# Patient Record
Sex: Male | Born: 1950 | Race: Black or African American | Hispanic: No | Marital: Married | Smoking: Former smoker
Health system: Southern US, Community
[De-identification: ages and names within clinical notes are randomized; demographics above are authoritative.]

## PROBLEM LIST (undated history)

## (undated) DIAGNOSIS — E119 Type 2 diabetes mellitus without complications: Secondary | ICD-10-CM

## (undated) DIAGNOSIS — E7800 Pure hypercholesterolemia, unspecified: Secondary | ICD-10-CM

## (undated) DIAGNOSIS — I219 Acute myocardial infarction, unspecified: Secondary | ICD-10-CM

## (undated) DIAGNOSIS — I10 Essential (primary) hypertension: Secondary | ICD-10-CM

## (undated) HISTORY — PX: CATARACT EXTRACTION: SUR2

## (undated) HISTORY — PX: CORONARY ANGIOPLASTY WITH STENT PLACEMENT: SHX49

## (undated) HISTORY — PX: HERNIA REPAIR: SHX51

---

## 2003-02-05 ENCOUNTER — Encounter: Payer: Self-pay | Admitting: Otolaryngology

## 2003-02-05 ENCOUNTER — Encounter: Admission: RE | Admit: 2003-02-05 | Discharge: 2003-02-05 | Payer: Self-pay | Admitting: Otolaryngology

## 2003-03-10 ENCOUNTER — Emergency Department (HOSPITAL_COMMUNITY): Admission: EM | Admit: 2003-03-10 | Discharge: 2003-03-11 | Payer: Self-pay | Admitting: Emergency Medicine

## 2003-03-16 ENCOUNTER — Encounter: Admission: RE | Admit: 2003-03-16 | Discharge: 2003-06-14 | Payer: Self-pay | Admitting: Family Medicine

## 2003-06-15 ENCOUNTER — Encounter: Payer: Self-pay | Admitting: General Practice

## 2003-06-15 ENCOUNTER — Encounter: Admission: RE | Admit: 2003-06-15 | Discharge: 2003-06-15 | Payer: Self-pay | Admitting: General Practice

## 2005-02-12 ENCOUNTER — Ambulatory Visit (HOSPITAL_COMMUNITY): Admission: RE | Admit: 2005-02-12 | Discharge: 2005-02-12 | Payer: Self-pay | Admitting: Otolaryngology

## 2016-12-04 ENCOUNTER — Emergency Department (HOSPITAL_BASED_OUTPATIENT_CLINIC_OR_DEPARTMENT_OTHER): Payer: Managed Care, Other (non HMO)

## 2016-12-04 ENCOUNTER — Emergency Department (HOSPITAL_BASED_OUTPATIENT_CLINIC_OR_DEPARTMENT_OTHER)
Admission: EM | Admit: 2016-12-04 | Discharge: 2016-12-05 | Disposition: A | Discharge status: Home / Self Care | Payer: Managed Care, Other (non HMO) | Attending: Emergency Medicine | Admitting: Emergency Medicine

## 2016-12-04 ENCOUNTER — Encounter (HOSPITAL_BASED_OUTPATIENT_CLINIC_OR_DEPARTMENT_OTHER): Payer: Self-pay

## 2016-12-04 DIAGNOSIS — I252 Old myocardial infarction: Secondary | ICD-10-CM | POA: Diagnosis not present

## 2016-12-04 DIAGNOSIS — Z7984 Long term (current) use of oral hypoglycemic drugs: Secondary | ICD-10-CM | POA: Diagnosis not present

## 2016-12-04 DIAGNOSIS — E119 Type 2 diabetes mellitus without complications: Secondary | ICD-10-CM | POA: Insufficient documentation

## 2016-12-04 DIAGNOSIS — Z79899 Other long term (current) drug therapy: Secondary | ICD-10-CM | POA: Insufficient documentation

## 2016-12-04 DIAGNOSIS — H9319 Tinnitus, unspecified ear: Secondary | ICD-10-CM | POA: Insufficient documentation

## 2016-12-04 DIAGNOSIS — Z87891 Personal history of nicotine dependence: Secondary | ICD-10-CM | POA: Insufficient documentation

## 2016-12-04 DIAGNOSIS — Z5181 Encounter for therapeutic drug level monitoring: Secondary | ICD-10-CM | POA: Diagnosis not present

## 2016-12-04 DIAGNOSIS — R42 Dizziness and giddiness: Secondary | ICD-10-CM | POA: Diagnosis present

## 2016-12-04 DIAGNOSIS — I10 Essential (primary) hypertension: Secondary | ICD-10-CM | POA: Insufficient documentation

## 2016-12-04 DIAGNOSIS — Z7982 Long term (current) use of aspirin: Secondary | ICD-10-CM | POA: Insufficient documentation

## 2016-12-04 HISTORY — DX: Type 2 diabetes mellitus without complications: E11.9

## 2016-12-04 HISTORY — DX: Essential (primary) hypertension: I10

## 2016-12-04 HISTORY — DX: Acute myocardial infarction, unspecified: I21.9

## 2016-12-04 HISTORY — DX: Pure hypercholesterolemia, unspecified: E78.00

## 2016-12-04 LAB — CBG MONITORING, ED: GLUCOSE-CAPILLARY: 156 mg/dL — AB (ref 65–99)

## 2016-12-04 NOTE — ED Triage Notes (Signed)
Per grandson-pt became dizzy, HA, fell x 2 started approx 8pm-denies HA at this time-steady gait

## 2016-12-04 NOTE — ED Provider Notes (Signed)
[facility] Note   CSN: [id number] Arrival date & time: 12/04/16  2240  By signing my name below, I, Rosario Adie, attest that this documentation has been prepared under the direction and in the presence of Zadie Rhine, MD. Electronically Signed: Rosario Adie, ED Scribe. 12/04/16. 11:15 PM.  History   Chief Complaint Chief Complaint  Patient presents with  . Dizziness   The history is provided by the patient and a relative. No language interpreter was used.  Dizziness  Quality:  Room spinning Severity:  Moderate Onset quality:  Sudden Progression:  Resolved Chronicity:  New Context: not with loss of consciousness   Ineffective treatments:  None tried Associated symptoms: tinnitus   Associated symptoms: no chest pain, no headaches and no weakness     HPI Comments: Jerry Porter is a 65 y.o. male with a PMHx of DM, HLD, HTN, and prior MI, who presents to the Emergency Department complaining of sudden onset, resolved episode of room-spinning dizziness beginning approximately 4 hours ago at 7:00PM. 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 He states that his sense of balance was secondarily affected by his dizziness, causing him to fall twice onto the ground during onset. He denies LOC, head injury, or injury otherwise during this. Pt additionally states his hearing in his left became altered (?tinnitus) during the onset of his symptoms. Prior to the onset of his dizziness today he states that he was feeling at his baseline and asymptomatic. Per family, he has otherwise been acting at his baseline since this issue resolved. He denies headache, visual disturbance, chest pain, abdominal pain, focal numbness/weakness, or any other associated symptoms.   Past Medical History:  Diagnosis Date  . Diabetes mellitus without complication (HCC)   . High cholesterol   . Hypertension   .  Myocardial infarct    There are no active problems to display for this patient.  Past Surgical History:  Procedure Laterality Date  . CATARACT EXTRACTION    . CORONARY ANGIOPLASTY WITH STENT PLACEMENT    . HERNIA REPAIR      Home Medications    Prior to Admission medications   Medication Sig Start Date End Date Taking? Authorizing Provider  amLODipine (NORVASC) 10 MG tablet Take 10 mg by mouth daily.   Yes Historical Provider, MD  aspirin 325 MG tablet Take 325 mg by mouth daily.   Yes Historical Provider, MD  atorvastatin (LIPITOR) 80 MG tablet Take 80 mg by mouth daily.   Yes Historical Provider, MD  glipiZIDE (GLUCOTROL) 5 MG tablet Take by mouth daily before breakfast.   Yes Historical Provider, MD  losartan-hydrochlorothiazide (HYZAAR) 100-25 MG tablet Take 1 tablet by mouth daily.   Yes Historical Provider, MD  metoprolol succinate (TOPROL-XL) 50 MG 24 hr tablet Take 50 mg by mouth daily. Take with or immediately following a meal.   Yes Historical Provider, MD   Family History No family history on file.  Social History Social History  Substance Use Topics  . Smoking status: Former Games developer  . Smokeless tobacco: Never Used  . Alcohol use No   Allergies   Patient has no known allergies.  Review of Systems Review of Systems  HENT: Positive for tinnitus.   Eyes: Negative for visual disturbance.  Cardiovascular: Negative for chest pain.  Gastrointestinal: Negative for abdominal pain.  Neurological: Positive for dizziness. Negative for weakness, numbness and headaches.  All other systems  reviewed and are negative.  Physical Exam Updated Vital Signs BP 177/79 (BP Location: Left Arm)   Pulse (!) 56   Temp 97.9 F (36.6 C) (Oral)   Resp 20   SpO2 100%   Physical Exam  CONSTITUTIONAL: Well developed/well nourished HEAD: Normocephalic/atraumatic EYES: EOMI/PERRL, mild horizontal nystagmus, no ptosis ENMT: Mucous membranes moist, bilateral hearing aids noted NECK:  supple no meningeal signs, no bruits CV: S1/S2 noted, no murmurs/rubs/gallops noted LUNGS: Lungs are clear to auscultation bilaterally, no apparent distress ABDOMEN: soft, nontender, no rebound or guarding GU:no cva tenderness NEURO:Awake/alert, face symmetric, no arm or leg drift is noted Equal 5/5 strength with shoulder abduction, elbow flex/extension, wrist flex/extension in upper extremities and equal hand grips bilaterally Equal 5/5 strength with hip flexion,knee flex/extension, foot dorsi/plantar flexion Cranial nerves 3/4/5/6/06/17/09/11/12 tested and intact Gait normal without ataxia No past pointing Sensation to light touch intact in all extremities EXTREMITIES: pulses normal, full ROM SKIN: warm, color normal PSYCH: no abnormalities of mood noted  ED Treatments / Results  DIAGNOSTIC STUDIES: Oxygen Saturation is 100% on RA, normal by my interpretation.   COORDINATION OF CARE: 11:15 PM-Discussed next steps with pt. Pt verbalized understanding and is agreeable with the plan.   Labs (all labs ordered are listed, but only abnormal results are displayed) Labs Reviewed  COMPREHENSIVE METABOLIC PANEL - Abnormal; Notable for the following:       Result Value   Glucose, Bld 164 (*)    BUN 29 (*)    All other components within normal limits  CBG MONITORING, ED - Abnormal; Notable for the following:    Glucose-Capillary 156 (*)    All other components within normal limits  ETHANOL  PROTIME-INR  CBC  DIFFERENTIAL  RAPID URINE DRUG SCREEN, HOSP PERFORMED  URINALYSIS, ROUTINE W REFLEX MICROSCOPIC   EKG  EKG Interpretation  Date/Time:  Tuesday December 04 2016 22:58:34 EST Ventricular Rate:  55 PR Interval:    QRS Duration: 90 QT Interval:  413 QTC Calculation: 395 R Axis:   82 Text Interpretation:  Sinus rhythm Borderline right axis deviation Minimal ST elevation, anterior leads Baseline wander in lead(s) V5 Interpretation limited secondary to artifact Confirmed by  Bebe Shaggy  MD, Arlinda Barcelona ([id number]) on 12/04/2016 11:06:36 PM      Radiology Ct Head Wo Contrast  Result Date: 12/05/2016 CLINICAL DATA:  Dizziness.  Headache. EXAM: CT HEAD WITHOUT CONTRAST TECHNIQUE: Contiguous axial images were obtained from the base of the skull through the vertex without intravenous contrast. COMPARISON:  Report from brain MRI 02/05/2003, images not available. FINDINGS: Brain: No evidence of acute infarction, hemorrhage, hydrocephalus, extra-axial collection or mass lesion/mass effect. Remote right cerebellar infarct. Symmetric basal gangliar calcifications. Vascular: Atherosclerosis of skullbase vasculature without hyperdense vessel or abnormal calcification. Skull: Normal. Negative for fracture or focal lesion. Sinuses/Orbits: Paranasal sinuses and mastoid air cells are clear. The visualized orbits are unremarkable. Other: None. IMPRESSION: 1.  No acute intracranial abnormality. 2. Remote right cerebellar infarct. Electronically Signed   By: Rubye Oaks M.D.   On: 12/05/2016 00:01    Procedures Procedures   Medications Ordered in ED Medications - No data to display  Initial Impression / Assessment and Plan / ED Course  I have reviewed the triage vital signs and the nursing notes.  Pertinent labs & imaging results that were available during my care of the patient were reviewed by me and considered in my medical decision making (see chart for details).  Clinical Course    12:17 AM Pt  stable He is symptom free CT findings shows remote infartct.  This has been noted on previous MRI brain 1:21 AM This patient looks well He is smiling, no distress He has been symptom free essentially entire ED stay He is ambulatory without any complaints He had no other focal neuro complaints, no focal weakness CT head showed distant infarct but none that are new Due to complete resolution of symptoms and his appearance/exam, will d/c home Strong suspicion for peripheral  vertigo He is comfortable with this plan His family is agreeable with plan We discussed strict ER return precautions   Final Clinical Impressions(s) / ED Diagnoses   Final diagnoses:  Vertigo   New Prescriptions New Prescriptions   MECLIZINE (ANTIVERT) 25 MG TABLET    Take 1 tablet (25 mg total) by mouth 3 (three) times daily as needed for dizziness.   I personally performed the services described in this documentation, which was scribed in my presence. The recorded information has been reviewed and is accurate.       Zadie Rhineonald Kylar Leonhardt, MD 12/05/16 [phone]

## 2016-12-05 LAB — URINALYSIS, ROUTINE W REFLEX MICROSCOPIC
Bilirubin Urine: NEGATIVE
GLUCOSE, UA: NEGATIVE mg/dL
Hgb urine dipstick: NEGATIVE
Ketones, ur: NEGATIVE mg/dL
LEUKOCYTES UA: NEGATIVE
NITRITE: NEGATIVE
PH: 5.5 (ref 5.0–8.0)
Protein, ur: NEGATIVE mg/dL
SPECIFIC GRAVITY, URINE: 1.012 (ref 1.005–1.030)

## 2016-12-05 LAB — CBC
HEMATOCRIT: 42.5 % (ref 39.0–52.0)
HEMOGLOBIN: 14.3 g/dL (ref 13.0–17.0)
MCH: 27.1 pg (ref 26.0–34.0)
MCHC: 33.6 g/dL (ref 30.0–36.0)
MCV: 80.5 fL (ref 78.0–100.0)
Platelets: 154 10*3/uL (ref 150–400)
RBC: 5.28 MIL/uL (ref 4.22–5.81)
RDW: 14.5 % (ref 11.5–15.5)
WBC: 5.2 10*3/uL (ref 4.0–10.5)

## 2016-12-05 LAB — COMPREHENSIVE METABOLIC PANEL
ALBUMIN: 4.1 g/dL (ref 3.5–5.0)
ALK PHOS: 54 U/L (ref 38–126)
ALT: 22 U/L (ref 17–63)
AST: 31 U/L (ref 15–41)
Anion gap: 7 (ref 5–15)
BILIRUBIN TOTAL: 0.5 mg/dL (ref 0.3–1.2)
BUN: 29 mg/dL — AB (ref 6–20)
CALCIUM: 9.4 mg/dL (ref 8.9–10.3)
CO2: 28 mmol/L (ref 22–32)
CREATININE: 1.12 mg/dL (ref 0.61–1.24)
Chloride: 101 mmol/L (ref 101–111)
GFR calc Af Amer: >60 mL/min (ref >60)
GLUCOSE: 164 mg/dL — AB (ref 65–99)
Potassium: 3.8 mmol/L (ref 3.5–5.1)
Sodium: 136 mmol/L (ref 135–145)
TOTAL PROTEIN: 7.3 g/dL (ref 6.5–8.1)

## 2016-12-05 LAB — DIFFERENTIAL
BASOS ABS: 0 10*3/uL (ref 0.0–0.1)
Basophils Relative: 0 %
EOS PCT: 4 %
Eosinophils Absolute: 0.2 10*3/uL (ref 0.0–0.7)
LYMPHS ABS: 1.2 10*3/uL (ref 0.7–4.0)
LYMPHS PCT: 22 %
MONOS PCT: 12 %
Monocytes Absolute: 0.7 10*3/uL (ref 0.1–1.0)
NEUTROS PCT: 62 %
Neutro Abs: 3.4 10*3/uL (ref 1.7–7.7)

## 2016-12-05 LAB — PROTIME-INR
INR: 0.96
PROTHROMBIN TIME: 12.8 s (ref 11.4–15.2)

## 2016-12-05 LAB — ETHANOL: Alcohol, Ethyl (B): <5 mg/dL (ref <5)

## 2016-12-05 LAB — RAPID URINE DRUG SCREEN, HOSP PERFORMED
Amphetamines: NOT DETECTED
Barbiturates: NOT DETECTED
Benzodiazepines: NOT DETECTED
Cocaine: NOT DETECTED
OPIATES: NOT DETECTED
Tetrahydrocannabinol: NOT DETECTED

## 2016-12-05 MED ORDER — MECLIZINE HCL 25 MG PO TABS: 25.0000 mg | ORAL_TABLET | Freq: Three times a day (TID) | ORAL | 0 refills | Status: DC | PRN

## 2016-12-05 NOTE — Discharge Instructions (Signed)

## 2016-12-05 NOTE — ED Notes (Signed)
Pt denies dizziness at this time.

## 2018-11-14 ENCOUNTER — Emergency Department (HOSPITAL_BASED_OUTPATIENT_CLINIC_OR_DEPARTMENT_OTHER): Payer: Managed Care, Other (non HMO)

## 2018-11-14 ENCOUNTER — Ambulatory Visit (HOSPITAL_BASED_OUTPATIENT_CLINIC_OR_DEPARTMENT_OTHER): Payer: Managed Care, Other (non HMO)

## 2018-11-14 ENCOUNTER — Observation Stay (HOSPITAL_BASED_OUTPATIENT_CLINIC_OR_DEPARTMENT_OTHER)
Admission: EM | Admit: 2018-11-14 | Discharge: 2018-11-15 | Disposition: A | Discharge status: Home / Self Care | Payer: Managed Care, Other (non HMO) | Attending: Family Medicine | Admitting: Family Medicine

## 2018-11-14 ENCOUNTER — Other Ambulatory Visit: Payer: Self-pay

## 2018-11-14 ENCOUNTER — Encounter (HOSPITAL_BASED_OUTPATIENT_CLINIC_OR_DEPARTMENT_OTHER): Payer: Self-pay | Admitting: *Deleted

## 2018-11-14 ENCOUNTER — Observation Stay (HOSPITAL_COMMUNITY): Payer: Managed Care, Other (non HMO)

## 2018-11-14 DIAGNOSIS — E1122 Type 2 diabetes mellitus with diabetic chronic kidney disease: Secondary | ICD-10-CM

## 2018-11-14 DIAGNOSIS — Z79899 Other long term (current) drug therapy: Secondary | ICD-10-CM | POA: Insufficient documentation

## 2018-11-14 DIAGNOSIS — H8110 Benign paroxysmal vertigo, unspecified ear: Secondary | ICD-10-CM

## 2018-11-14 DIAGNOSIS — R2990 Unspecified symptoms and signs involving the nervous system: Secondary | ICD-10-CM | POA: Diagnosis not present

## 2018-11-14 DIAGNOSIS — E785 Hyperlipidemia, unspecified: Secondary | ICD-10-CM | POA: Diagnosis not present

## 2018-11-14 DIAGNOSIS — E1129 Type 2 diabetes mellitus with other diabetic kidney complication: Secondary | ICD-10-CM | POA: Diagnosis not present

## 2018-11-14 DIAGNOSIS — Z7982 Long term (current) use of aspirin: Secondary | ICD-10-CM | POA: Insufficient documentation

## 2018-11-14 DIAGNOSIS — I361 Nonrheumatic tricuspid (valve) insufficiency: Secondary | ICD-10-CM

## 2018-11-14 DIAGNOSIS — E871 Hypo-osmolality and hyponatremia: Secondary | ICD-10-CM | POA: Diagnosis present

## 2018-11-14 DIAGNOSIS — N179 Acute kidney failure, unspecified: Secondary | ICD-10-CM | POA: Diagnosis not present

## 2018-11-14 DIAGNOSIS — I25119 Atherosclerotic heart disease of native coronary artery with unspecified angina pectoris: Secondary | ICD-10-CM

## 2018-11-14 DIAGNOSIS — Z87891 Personal history of nicotine dependence: Secondary | ICD-10-CM | POA: Diagnosis not present

## 2018-11-14 DIAGNOSIS — Z955 Presence of coronary angioplasty implant and graft: Secondary | ICD-10-CM | POA: Insufficient documentation

## 2018-11-14 DIAGNOSIS — R05 Cough: Secondary | ICD-10-CM

## 2018-11-14 DIAGNOSIS — I10 Essential (primary) hypertension: Secondary | ICD-10-CM | POA: Insufficient documentation

## 2018-11-14 DIAGNOSIS — R42 Dizziness and giddiness: Principal | ICD-10-CM

## 2018-11-14 DIAGNOSIS — I2510 Atherosclerotic heart disease of native coronary artery without angina pectoris: Secondary | ICD-10-CM | POA: Diagnosis not present

## 2018-11-14 DIAGNOSIS — N182 Chronic kidney disease, stage 2 (mild): Secondary | ICD-10-CM

## 2018-11-14 DIAGNOSIS — E876 Hypokalemia: Secondary | ICD-10-CM | POA: Diagnosis not present

## 2018-11-14 DIAGNOSIS — R059 Cough, unspecified: Secondary | ICD-10-CM

## 2018-11-14 LAB — DIFFERENTIAL
ABS IMMATURE GRANULOCYTES: 0.04 10*3/uL (ref 0.00–0.07)
Basophils Absolute: 0 10*3/uL (ref 0.0–0.1)
Basophils Relative: 0 %
Eosinophils Absolute: 0.1 10*3/uL (ref 0.0–0.5)
Eosinophils Relative: 1 %
IMMATURE GRANULOCYTES: 1 %
Lymphocytes Relative: 24 %
Lymphs Abs: 1.6 10*3/uL (ref 0.7–4.0)
Monocytes Absolute: 0.8 10*3/uL (ref 0.1–1.0)
Monocytes Relative: 12 %
NEUTROS ABS: 4.3 10*3/uL (ref 1.7–7.7)
Neutrophils Relative %: 62 %

## 2018-11-14 LAB — PROTIME-INR
INR: 1.14
Prothrombin Time: 14.5 seconds (ref 11.4–15.2)

## 2018-11-14 LAB — HEMOGLOBIN A1C
Hgb A1c MFr Bld: 6.3 % — ABNORMAL HIGH (ref 4.8–5.6)
MEAN PLASMA GLUCOSE: 134.11 mg/dL

## 2018-11-14 LAB — COMPREHENSIVE METABOLIC PANEL
ALT: 26 U/L (ref 0–44)
AST: 36 U/L (ref 15–41)
Albumin: 4.1 g/dL (ref 3.5–5.0)
Alkaline Phosphatase: 57 U/L (ref 38–126)
Anion gap: 13 (ref 5–15)
BILIRUBIN TOTAL: 1.3 mg/dL — AB (ref 0.3–1.2)
BUN: 45 mg/dL — ABNORMAL HIGH (ref 8–23)
CO2: 21 mmol/L — ABNORMAL LOW (ref 22–32)
Calcium: 9 mg/dL (ref 8.9–10.3)
Chloride: 94 mmol/L — ABNORMAL LOW (ref 98–111)
Creatinine, Ser: 1.95 mg/dL — ABNORMAL HIGH (ref 0.61–1.24)
GFR calc Af Amer: 40 mL/min — ABNORMAL LOW (ref >60)
GFR calc non Af Amer: 35 mL/min — ABNORMAL LOW (ref >60)
Glucose, Bld: 98 mg/dL (ref 70–99)
Potassium: 3.2 mmol/L — ABNORMAL LOW (ref 3.5–5.1)
Sodium: 128 mmol/L — ABNORMAL LOW (ref 135–145)
Total Protein: 8 g/dL (ref 6.5–8.1)

## 2018-11-14 LAB — URINALYSIS, MICROSCOPIC (REFLEX)
Bacteria, UA: NONE SEEN
RBC / HPF: NONE SEEN RBC/hpf (ref 0–5)
Squamous Epithelial / HPF: NONE SEEN (ref 0–5)
WBC, UA: NONE SEEN WBC/hpf (ref 0–5)

## 2018-11-14 LAB — ECHOCARDIOGRAM COMPLETE
Height: 72 in
Weight: 2557.34 oz

## 2018-11-14 LAB — RENAL FUNCTION PANEL
Albumin: 3.5 g/dL (ref 3.5–5.0)
Anion gap: 14 (ref 5–15)
BUN: 25 mg/dL — AB (ref 8–23)
CO2: 21 mmol/L — ABNORMAL LOW (ref 22–32)
Calcium: 9 mg/dL (ref 8.9–10.3)
Chloride: 98 mmol/L (ref 98–111)
Creatinine, Ser: 1.38 mg/dL — ABNORMAL HIGH (ref 0.61–1.24)
GFR calc Af Amer: >60 mL/min (ref >60)
GFR calc non Af Amer: 53 mL/min — ABNORMAL LOW (ref >60)
Glucose, Bld: 197 mg/dL — ABNORMAL HIGH (ref 70–99)
PHOSPHORUS: 1.9 mg/dL — AB (ref 2.5–4.6)
Potassium: 4.3 mmol/L (ref 3.5–5.1)
Sodium: 133 mmol/L — ABNORMAL LOW (ref 135–145)

## 2018-11-14 LAB — CBC
HCT: 40.6 % (ref 39.0–52.0)
HCT: 40.2 % (ref 39.0–52.0)
Hemoglobin: 13.5 g/dL (ref 13.0–17.0)
Hemoglobin: 13.4 g/dL (ref 13.0–17.0)
MCH: 26.9 pg (ref 26.0–34.0)
MCH: 27.1 pg (ref 26.0–34.0)
MCHC: 33.3 g/dL (ref 30.0–36.0)
MCHC: 33.3 g/dL (ref 30.0–36.0)
MCV: 81 fL (ref 80.0–100.0)
MCV: 81.2 fL (ref 80.0–100.0)
Platelets: 182 10*3/uL (ref 150–400)
Platelets: 197 10*3/uL (ref 150–400)
RBC: 5.01 MIL/uL (ref 4.22–5.81)
RBC: 4.95 MIL/uL (ref 4.22–5.81)
RDW: 13.5 % (ref 11.5–15.5)
RDW: 13.7 % (ref 11.5–15.5)
WBC: 6.6 10*3/uL (ref 4.0–10.5)
WBC: 6.8 10*3/uL (ref 4.0–10.5)
nRBC: 0 % (ref 0.0–0.2)
nRBC: 0 % (ref 0.0–0.2)

## 2018-11-14 LAB — LIPID PANEL
Cholesterol: 90 mg/dL (ref 0–200)
HDL: 34 mg/dL — ABNORMAL LOW (ref >40)
LDL Cholesterol: 45 mg/dL (ref 0–99)
Total CHOL/HDL Ratio: 2.6 RATIO
Triglycerides: 57 mg/dL (ref <150)
VLDL: 11 mg/dL (ref 0–40)

## 2018-11-14 LAB — GLUCOSE, CAPILLARY
Glucose-Capillary: 87 mg/dL (ref 70–99)
Glucose-Capillary: 225 mg/dL — ABNORMAL HIGH (ref 70–99)
Glucose-Capillary: 148 mg/dL — ABNORMAL HIGH (ref 70–99)
Glucose-Capillary: 145 mg/dL — ABNORMAL HIGH (ref 70–99)

## 2018-11-14 LAB — URINALYSIS, ROUTINE W REFLEX MICROSCOPIC
BILIRUBIN URINE: NEGATIVE
Glucose, UA: NEGATIVE mg/dL
Ketones, ur: 15 mg/dL — AB
Leukocytes, UA: NEGATIVE
Nitrite: NEGATIVE
Protein, ur: NEGATIVE mg/dL
Specific Gravity, Urine: <1.005 — ABNORMAL LOW (ref 1.005–1.030)
pH: 7 (ref 5.0–8.0)

## 2018-11-14 LAB — RAPID URINE DRUG SCREEN, HOSP PERFORMED
Amphetamines: NOT DETECTED
Barbiturates: NOT DETECTED
Benzodiazepines: NOT DETECTED
Cocaine: NOT DETECTED
OPIATES: NOT DETECTED
Tetrahydrocannabinol: NOT DETECTED

## 2018-11-14 LAB — LIPASE, BLOOD: Lipase: 39 U/L (ref 11–51)

## 2018-11-14 LAB — VITAMIN B12: Vitamin B-12: 975 pg/mL — ABNORMAL HIGH (ref 180–914)

## 2018-11-14 LAB — TROPONIN I: Troponin I: <0.03 ng/mL (ref <0.03)

## 2018-11-14 LAB — APTT: aPTT: 29 seconds (ref 24–36)

## 2018-11-14 LAB — CBG MONITORING, ED: Glucose-Capillary: 85 mg/dL (ref 70–99)

## 2018-11-14 LAB — TSH: TSH: 1.434 u[IU]/mL (ref 0.350–4.500)

## 2018-11-14 LAB — ETHANOL: Alcohol, Ethyl (B): <10 mg/dL (ref <10)

## 2018-11-14 MED ORDER — IOPAMIDOL (ISOVUE-370) INJECTION 76%
INTRAVENOUS | Status: AC
  Filled 2018-11-14: qty 100

## 2018-11-14 MED ORDER — POTASSIUM CHLORIDE CRYS ER 20 MEQ PO TBCR
40.0000 meq | EXTENDED_RELEASE_TABLET | Freq: Once | ORAL | Status: AC
  Administered 2018-11-14: 40 meq via ORAL
  Filled 2018-11-14: qty 2

## 2018-11-14 MED ORDER — SODIUM CHLORIDE 0.9 % IV SOLN
INTRAVENOUS | Status: DC
  Administered 2018-11-14: 05:00:00 via INTRAVENOUS

## 2018-11-14 MED ORDER — CLOPIDOGREL BISULFATE 75 MG PO TABS
75.0000 mg | ORAL_TABLET | Freq: Every day | ORAL | Status: DC
  Administered 2018-11-14: 75 mg via ORAL
  Filled 2018-11-14: qty 1

## 2018-11-14 MED ORDER — SODIUM CHLORIDE 0.9 % IV BOLUS
1000.0000 mL | Freq: Once | INTRAVENOUS | Status: AC
  Administered 2018-11-14: 1000 mL via INTRAVENOUS

## 2018-11-14 MED ORDER — ONDANSETRON HCL 4 MG/2ML IJ SOLN
4.0000 mg | Freq: Once | INTRAMUSCULAR | Status: AC | PRN
  Administered 2018-11-14: 4 mg via INTRAVENOUS
  Filled 2018-11-14: qty 2

## 2018-11-14 MED ORDER — ASPIRIN EC 325 MG PO TBEC
325.0000 mg | DELAYED_RELEASE_TABLET | Freq: Every day | ORAL | Status: DC
  Administered 2018-11-15: 325 mg via ORAL
  Filled 2018-11-14: qty 1

## 2018-11-14 MED ORDER — MECLIZINE HCL 12.5 MG PO TABS: 25.0000 mg | ORAL_TABLET | Freq: Three times a day (TID) | ORAL | Status: DC | PRN

## 2018-11-14 MED ORDER — ASPIRIN EC 81 MG PO TBEC
81.0000 mg | DELAYED_RELEASE_TABLET | Freq: Every day | ORAL | Status: DC
  Administered 2018-11-14: 81 mg via ORAL
  Filled 2018-11-14: qty 1

## 2018-11-14 MED ORDER — GLIPIZIDE 5 MG PO TABS
5.0000 mg | ORAL_TABLET | Freq: Every day | ORAL | Status: DC
  Administered 2018-11-15: 5 mg via ORAL
  Filled 2018-11-14 (×2): qty 1

## 2018-11-14 MED ORDER — LACTATED RINGERS IV BOLUS
1000.0000 mL | Freq: Once | INTRAVENOUS | Status: AC
  Administered 2018-11-14: 1000 mL via INTRAVENOUS

## 2018-11-14 MED ORDER — IOPAMIDOL (ISOVUE-370) INJECTION 76%
60.0000 mL | Freq: Once | INTRAVENOUS | Status: AC | PRN
  Administered 2018-11-14: 60 mL via INTRAVENOUS

## 2018-11-14 MED ORDER — POTASSIUM CHLORIDE IN NACL 40-0.9 MEQ/L-% IV SOLN
INTRAVENOUS | Status: DC
  Administered 2018-11-14 – 2018-11-15 (×2): 100 mL/h via INTRAVENOUS
  Filled 2018-11-14 (×3): qty 1000

## 2018-11-14 MED ORDER — METOPROLOL SUCCINATE ER 25 MG PO TB24
50.0000 mg | ORAL_TABLET | Freq: Every day | ORAL | Status: DC
  Administered 2018-11-14 – 2018-11-15 (×2): 50 mg via ORAL
  Filled 2018-11-14 (×2): qty 2

## 2018-11-14 MED ORDER — ASPIRIN 325 MG PO TABS: 325.0000 mg | ORAL_TABLET | Freq: Every day | ORAL | Status: DC

## 2018-11-14 MED ORDER — ATORVASTATIN CALCIUM 80 MG PO TABS
80.0000 mg | ORAL_TABLET | Freq: Every day | ORAL | Status: DC
  Administered 2018-11-14: 80 mg via ORAL
  Filled 2018-11-14: qty 1

## 2018-11-14 NOTE — H&P (Addendum)
History and Physical:    Jerry Porter   ZOX:[id number] DOB: June 08, 1951 DOA: 11/14/2018  Referring MD/provider: Dr. Shaune Pollack  PCP: Patient, No Pcp Per   Patient coming from: Home   Chief Complaint: Dizziness and nausea while at work.  History of Present Illness:   Jerry Porter is an 67 y.o. male with a PMH of HTN, HLD, DM, CAD s/p MI who presents with a complaint of the acute onset of dizziness and nausea while at work and associated with positional changes. Vertigo symptoms worse with movement.  Had an episode of vertigo 1 year ago.  Associated symptoms include an episode of nausea and vomiting last night, and recent URI symptoms including a cough.  Also reports pain in his right shoulder and left upper back.  ED Course:  Noted to have nystagmus on exam. The patient underwent a CT which showed a chronic right cerebellar infarct.  Evaluated by neurologist with no reports of focal neurological deficits. Patient was not a TPA candidate. MRI ordered. Labs significant for  sodium 128, potassium 3.2, chloride 94, CO2 21, BUN 45, creatinine 1.95. Patient was given 2 L of fluid, 40 mEq of potassium chloride, and Zofran with symptoms reportedly improving.    ROS:   Review of Systems  Constitutional: Negative.   HENT: Negative.   Eyes:       Nystagmus  Respiratory: Positive for cough. Negative for shortness of breath.   Cardiovascular: Negative.   Gastrointestinal: Positive for constipation, nausea and vomiting.  Genitourinary: Negative.   Musculoskeletal: Positive for back pain and myalgias.  Skin: Negative.   Neurological: Positive for dizziness.  Endo/Heme/Allergies: Negative.   Psychiatric/Behavioral: Negative.     Past Medical History:   Past Medical History:  Diagnosis Date  . Diabetes mellitus without complication (HCC)   . High cholesterol   . Hypertension   . Myocardial infarct [facility])     Past Surgical History:   Past Surgical History:  Procedure  Laterality Date  . CATARACT EXTRACTION    . CORONARY ANGIOPLASTY WITH STENT PLACEMENT    . HERNIA REPAIR      Social History:   Social History   Socioeconomic History  . Marital status: Married    Spouse name: Not on file  . Number of children: Not on file  . Years of education: Not on file  . Highest education level: Not on file  Occupational History  . Not on file  Social Needs  . Financial resource strain: Not on file  . Food insecurity:    Worry: Not on file    Inability: Not on file  . Transportation needs:    Medical: Not on file    Non-medical: Not on file  Tobacco Use  . Smoking status: Former Games developer  . Smokeless tobacco: Never Used  Substance and Sexual Activity  . Alcohol use: No  . Drug use: No  . Sexual activity: Not on file  Lifestyle  . Physical activity:    Days per week: Not on file    Minutes per session: Not on file  . Stress: Not on file  Relationships  . Social connections:    Talks on phone: Not on file    Gets together: Not on file    Attends religious service: Not on file    Active member of club or organization: Not on file    Attends meetings of clubs or organizations: Not on file    Relationship status: Not on file  .  Intimate partner violence:    Fear of current or ex partner: Not on file    Emotionally abused: Not on file    Physically abused: Not on file    Forced sexual activity: Not on file  Other Topics Concern  . Not on file  Social History Narrative  . Not on file    Allergies   Patient has no known allergies.  Family history:   History reviewed. No pertinent family history.  Reports a cousin has diabetes, but no other known family history.  Current Medications:   Prior to Admission medications   Medication Sig Start Date End Date Taking? Authorizing Provider  amLODipine (NORVASC) 10 MG tablet Take 10 mg by mouth daily.    [provider]  aspirin 325 MG tablet Take 325 mg by mouth daily.    [provider]  atorvastatin (LIPITOR) 80 MG tablet Take 80 mg by mouth daily.    [provider]  glipiZIDE (GLUCOTROL) 5 MG tablet Take by mouth daily before breakfast.    [provider]  losartan-hydrochlorothiazide (HYZAAR) 100-25 MG tablet Take 1 tablet by mouth daily.    [provider]  meclizine (ANTIVERT) 25 MG tablet Take 1 tablet (25 mg total) by mouth 3 (three) times daily as needed for dizziness. 12/05/16   Zadie Rhine, MD  metoprolol succinate (TOPROL-XL) 50 MG 24 hr tablet Take 50 mg by mouth daily. Take with or immediately following a meal.    [provider]    Physical Exam:   Vitals:   11/14/18 0430 11/14/18 0601 11/14/18 0723 11/14/18 0923  BP: 132/63 126/65 133/64 (!) 129/53  Pulse: 78 60 67 74  Resp: 20 18  19   Temp:  97.8 F (36.6 C)    TempSrc:  Oral    SpO2: 100% 100% 100% 100%  Weight:  72.5 kg    Height:  6' (1.829 m)       Physical Exam: Blood pressure (!) 129/53, pulse 74, temperature 97.8 F (36.6 C), temperature source Oral, resp. rate 19, height 6' (1.829 m), weight 72.5 kg, SpO2 100 %. Gen: No acute distress. Head: Normocephalic, atraumatic. Eyes: Pupils equal, round and reactive to light. Extraocular movements intact.  Sclerae nonicteric. No lid lag.  Arcus senilis noted bilaterally. Mouth: Oropharynx reveals moist mucous membranes. Dentition is fair. Neck: Supple, no thyromegaly, no lymphadenopathy, no jugular venous distention. Chest: Lungs are clear to auscultation with good air movement. No rales, rhonchi or wheezes.  CV: Heart sounds are regular with an S1, S2.  Grade 2/6 systolic murmur. Abdomen: Soft, nontender, nondistended with normal active bowel sounds. No hepatosplenomegaly or palpable masses. Extremities: Extremities are without clubbing, or cyanosis. No edema. Pedal pulses 2+.  Skin: Warm and dry. No rashes, lesions or wounds. Neuro: Alert and oriented times 3; grossly nonfocal.  2 beat  nystagmus on lateral gaze. Psych: Insight is good and judgment is appropriate. Mood and affect normal.   Data Review:    Labs: Basic Metabolic Panel: Recent Labs  Lab 11/14/18 0112  NA 128*  K 3.2*  CL 94*  CO2 21*  GLUCOSE 98  BUN 45*  CREATININE 1.95*  CALCIUM 9.0   Liver Function Tests: Recent Labs  Lab 11/14/18 0112  AST 36  ALT 26  ALKPHOS 57  BILITOT 1.3*  PROT 8.0  ALBUMIN 4.1   Recent Labs  Lab 11/14/18 0112  LIPASE 39   CBC: Recent Labs  Lab 11/14/18 0112  WBC 6.8  6.6  NEUTROABS 4.3  HGB 13.4  13.5  HCT 40.2  40.6  MCV 81.2  81.0  PLT 197  182   Cardiac Enzymes: Recent Labs  Lab 11/14/18 0112  TROPONINI <0.03    CBG: Recent Labs  Lab 11/14/18 0132 11/14/18 0605  GLUCAP 85 87    Urinalysis    Component Value Date/Time   COLORURINE COLORLESS (A) 11/14/2018 0500   APPEARANCEUR CLEAR 11/14/2018 0500   LABSPEC <1.005 (L) 11/14/2018 0500   PHURINE 7.0 11/14/2018 0500   GLUCOSEU NEGATIVE 11/14/2018 0500   HGBUR TRACE (A) 11/14/2018 0500   BILIRUBINUR NEGATIVE 11/14/2018 0500   KETONESUR 15 (A) 11/14/2018 0500   PROTEINUR NEGATIVE 11/14/2018 0500   NITRITE NEGATIVE 11/14/2018 0500   LEUKOCYTESUR NEGATIVE 11/14/2018 0500      Radiographic Studies: Ct Head Code Stroke Wo Contrast  Result Date: 11/14/2018 CLINICAL DATA:  Code stroke. Initial evaluation for acute dizziness. EXAM: CT HEAD WITHOUT CONTRAST TECHNIQUE: Contiguous axial images were obtained from the base of the skull through the vertex without intravenous contrast. COMPARISON:  Prior CT from 12/04/2016. FINDINGS: Brain: Age-related cerebral atrophy with mild chronic small vessel ischemic disease. No acute intracranial hemorrhage. No acute large vessel territory infarct. No mass lesion, midline shift or mass effect. No hydrocephalus. Chronic right cerebellar infarct noted. Vascular: No hyperdense vessel. Scattered vascular calcifications noted within the carotid  siphons. Skull: Scalp soft tissues and calvarium within normal limits. Sinuses/Orbits: Globes and orbital soft tissues within normal limits. Paranasal sinuses and mastoid air cells are clear. Other: None. ASPECTS [facility] Stroke Program Early CT Score) - Ganglionic level infarction (caudate, lentiform nuclei, internal capsule, insula, M1-M3 cortex): 7 - Supraganglionic infarction (M4-M6 cortex): 3 Total score (0-10 with 10 being normal): 10 IMPRESSION: 1. No acute intracranial abnormality. 2. ASPECTS is 10. 3. Chronic right cerebellar infarct. Critical Value/emergent results were called by telephone at the time of interpretation on 11/14/2018 at 2:28 am to Dr. Shaune Pollack , who verbally acknowledged these results. Electronically Signed   By: Rise Mu M.D.   On: 11/14/2018 02:28    EKG: Independently reviewed.  Sinus rhythm with right atrial enlargement.  No change from prior.  69 bpm.   Assessment/Plan:   Principal Problem:   Vertigo Sudden onset of symptoms consistent with acute vertigo, and it appears that this likely has been triggered by a URI.  Initial CT screening negative (personally reviewed) although it does show some evidence of prior cerebrovascular disease as pictured below on the right, raising the concern that this could be a TIA or stroke.  Neurology has been consulted.  Neuro exam nonfocal although noted to have some nystagmus on presentation.  Placed on aspirin and Plavix and a stroke work-up has been initiated with MRI and 2D echocardiogram. Meclizine ordered PRN.  Active Problems:   Benign essential HTN Blood pressure currently controlled.  Hold diuretics in the face of acute kidney injury and electrolyte abnormalities.  Continue beta-blocker.    CAD (coronary artery disease) Has been placed on aspirin and Plavix.  Continue beta-blocker.  Continue statin.    HLD (hyperlipidemia) Continue statin.  Check fasting lipid panel.    Diabetes mellitus type 2,  controlled, with renal complications (HCC) Patient has controlled diabetes on glipizide at baseline.  Check hemoglobin A1c.  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    Hypokalemia Likely from diuretic use.  Hold diuretics and supplement potassium in IV fluids.    Hyponatremia Likely  from diuretic use.  Hold diuretics and hydrate.    AKI (acute kidney injury) (HCC) Baseline creatinine appears to be around 1.1.  Current creatinine elevated over usual baseline baseline values.  Hydrate and monitor.   Body mass index is 21.68 kg/m.  Other information:   DVT prophylaxis: SCDs ordered. Code Status: Full code. Family Communication: Daughter at bedside. Disposition Plan: If no evidence of stroke, should be stable for discharge in the morning. Consults called: Neurology. Admission status: Observation.  The medical decision making on this patient was of high complexity and the patient is at high risk for clinical deterioration, therefore this is a level 3 visit.   Trula Ore Xoie Kreuser [facility] Pager [phone] Cell: [phone]   If 7PM-7AM, please contact night-coverage [email or web address] Password [email or web address] 11/14/2018, 9:57 AM

## 2018-11-14 NOTE — ED Notes (Signed)
Pt actively vomiting.

## 2018-11-14 NOTE — Consult Note (Signed)
Stroke Neurology Consultation Note  Consult Requested by: Dr. Gonzella Lex  Reason for Consult: stroke  Consult Date: 11/14/18  The history was obtained from the pt and interpretor.  During history and examination, all items were able to obtain unless otherwise noted.  History of Present Illness:  Jerry Porter is a 67 y.o. African American male with PMH of HTN, DM, HLD, CAD/MI s/p stent in 2008 on ASA and plavix presented with sudden onset dizziness and vertigo off work on standing up. He also felt nausea. Not able to get out of his car when driving home. EMS called and pt sent to ER where he vomited. Telestroke consulted and CT showed no acute abnormality but chronic right cerebellar infarct. NIHSS = 0 and concerning for peripheral vertigo, no tPA given. Pt transferred to [facility] for further evaluation.   LSN: 11/13/2018 23:55:00 tPA Given: No: considered peripheral vertigo  Past Medical History:  Diagnosis Date  . Diabetes mellitus without complication (HCC)   . High cholesterol   . Hypertension   . Myocardial infarct [facility])     Past Surgical History:  Procedure Laterality Date  . CATARACT EXTRACTION    . CORONARY ANGIOPLASTY WITH STENT PLACEMENT    . HERNIA REPAIR      History reviewed. No pertinent family history.  Social History:  reports that he has quit smoking. He has never used smokeless tobacco. He reports that he does not drink alcohol or use drugs.  Allergies: No Known Allergies  No current facility-administered medications on file prior to encounter.    Current Outpatient Medications on File Prior to Encounter  Medication Sig Dispense Refill  . amLODipine (NORVASC) 10 MG tablet Take 10 mg by mouth daily.    Marland Kitchen aspirin 325 MG tablet Take 325 mg by mouth daily.    Marland Kitchen atorvastatin (LIPITOR) 80 MG tablet Take 80 mg by mouth daily.    Marland Kitchen glipiZIDE (GLUCOTROL) 5 MG tablet Take by mouth daily before breakfast.    . losartan-hydrochlorothiazide (HYZAAR) 100-25 MG tablet Take 1  tablet by mouth daily.    . metoprolol succinate (TOPROL-XL) 50 MG 24 hr tablet Take 150 mg by mouth daily. Take with or immediately following a meal.     . meclizine (ANTIVERT) 25 MG tablet Take 1 tablet (25 mg total) by mouth 3 (three) times daily as needed for dizziness. (Patient not taking: Reported on 11/14/2018) 15 tablet 0    Review of Systems: A full ROS was attempted today and was able to be performed.  Systems assessed include - Constitutional, Eyes, HENT, Respiratory, Cardiovascular, Gastrointestinal, Genitourinary, Integument/breast, Hematologic/lymphatic, Musculoskeletal, Neurological, Behavioral/Psych, Endocrine, Allergic/Immunologic - with pertinent responses as per HPI.  Physical Examination: Temp:  [97.8 F (36.6 C)-98.4 F (36.9 C)] 98.4 F (36.9 C) (12/06 1123) Pulse Rate:  [60-78] 66 (12/06 1123) Resp:  [14-21] 18 (12/06 1400) BP: (111-142)/(53-89) 124/64 (12/06 1323) SpO2:  [97 %-100 %] 99 % (12/06 1400) Weight:  [72.5 kg-72.6 kg] 72.5 kg (12/06 0601)  General - well nourished, well developed, in no apparent distress.    Ophthalmologic - fundi not visualized due to noncooperation.    Cardiovascular - regular rate and rhythm  Mental Status -  Level of arousal and orientation to time, place, and person were intact. Language including expression, naming, repetition, comprehension, reading, and writing was assessed and found intact. Fund of Knowledge was assessed and was intact.  Cranial Nerves II - XII - II - Vision intact OU. III, IV, VI - Extraocular movements  intact. V - Facial sensation intact bilaterally. VII - Facial movement intact bilaterally. VIII - Hearing & vestibular intact bilaterally. X - Palate elevates symmetrically. XI - Chin turning & shoulder shrug intact bilaterally. XII - Tongue protrusion intact.  Motor Strength - The patient's strength was normal in all extremities and pronator drift was absent.   Motor Tone & Bulk - Muscle tone was  assessed at the neck and appendages and was normal.  Bulk was normal and fasciculations were absent.   Reflexes - The patient's reflexes were normal in all extremities and he had no pathological reflexes.  Sensory - Light touch, temperature/pinprick were assessed and were normal.    Coordination - The patient had normal movements in the hands with no ataxia or dysmetria.  Tremor was absent. Dix-Hallpike maneuver negative.   Gait and Station - slow transition from chair to bed and 2-3 tries to get out of chair.   Data Reviewed: Ct Angio Head W Or Wo Contrast  Result Date: 11/14/2018 CLINICAL DATA:  Vertigo. EXAM: CT ANGIOGRAPHY HEAD AND NECK TECHNIQUE: Multidetector CT imaging of the head and neck was performed using the standard protocol during bolus administration of intravenous contrast. Multiplanar CT image reconstructions and MIPs were obtained to evaluate the vascular anatomy. Carotid stenosis measurements (when applicable) are obtained utilizing NASCET criteria, using the distal internal carotid diameter as the denominator. CONTRAST:  60mL ISOVUE-370 IOPAMIDOL (ISOVUE-370) INJECTION 76% COMPARISON:  Noncontrast head CT 11/14/2018. Head MRV 02/12/2005. No prior arterial imaging. FINDINGS: CTA NECK FINDINGS Aortic arch: Standard 3 vessel aortic arch with mild atherosclerotic plaque. Widely patent arch vessel origins. Right carotid system: Patent with calcified plaque at the ICA origin not resulting in significant stenosis. Left carotid system: Patent with prominent calcified plaque in the carotid bulb resulting in less than 50% stenosis. Vertebral arteries: Patent with the right being moderately dominant. Calcified plaque at the right vertebral artery origin results in moderate to severe stenosis. Skeleton: Moderately advanced disc degeneration in the mid to lower cervical spine. Other neck: No evidence of acute abnormality or mass. Upper chest: Pleural-parenchymal scarring in the right greater than  left lung apices with bronchiectasis on the right. Partially visualized patchy to confluent ground-glass opacity in the superior segment of the right lower lobe. Review of the MIP images confirms the above findings CTA HEAD FINDINGS Anterior circulation: The internal carotid arteries are patent from skull base to carotid termini. There are mild right and moderate left cavernous stenoses. There are also moderate left paraclinoid and mild left ICA terminus stenoses, and there is mild ectasia of the mid to distal left supraclinoid ICA. The MCAs are patent without evidence of proximal branch occlusion. There is mild proximal left M1 stenosis, and there are bilateral MCA branch vessel atherosclerotic changes including moderate proximal left M2 stenoses. The right ACA is patent with up to mild A1 and proximal A2 stenoses. The left A1 segment is small with a severe stenosis proximally. The left ACA is occluded just distal to the origin of the orbitofrontal branch. Posterior circulation: The intracranial vertebral arteries are patent with the right supplying the basilar. The left vertebral artery terminates in PICA. Patent right PICA and bilateral SCA origins are also visualized. The basilar artery is patent with mild diffuse irregular narrowing. Posterior communicating arteries are diminutive or absent. PCAs are patent with diffuse atherosclerotic irregularity and moderate multifocal narrowing involving the P2 segments and more distal branches. No aneurysm is identified. Venous sinuses: Chronic occlusion or delayed filling of the  left sigmoid sinus. Anatomic variants: Left vertebral artery terminates in PICA. Delayed phase: No abnormal enhancement. Review of the MIP images confirms the above findings IMPRESSION: 1. Left A2 occlusion. 2. Widespread intracranial atherosclerosis including mild right and moderate left ICA stenoses, severe left A1 stenosis, and moderate M2 and P2 stenoses. 3. Moderate to severe right vertebral  artery origin stenosis. 4. Cervical carotid artery atherosclerosis without significant stenosis. 5.  Aortic Atherosclerosis (ICD10-I70.0). Electronically Signed   By: Sebastian Ache M.D.   On: 11/14/2018 10:12   Ct Angio Neck W Or Wo Contrast  Result Date: 11/14/2018 CLINICAL DATA:  Vertigo. EXAM: CT ANGIOGRAPHY HEAD AND NECK TECHNIQUE: Multidetector CT imaging of the head and neck was performed using the standard protocol during bolus administration of intravenous contrast. Multiplanar CT image reconstructions and MIPs were obtained to evaluate the vascular anatomy. Carotid stenosis measurements (when applicable) are obtained utilizing NASCET criteria, using the distal internal carotid diameter as the denominator. CONTRAST:  60mL ISOVUE-370 IOPAMIDOL (ISOVUE-370) INJECTION 76% COMPARISON:  Noncontrast head CT 11/14/2018. Head MRV 02/12/2005. No prior arterial imaging. FINDINGS: CTA NECK FINDINGS Aortic arch: Standard 3 vessel aortic arch with mild atherosclerotic plaque. Widely patent arch vessel origins. Right carotid system: Patent with calcified plaque at the ICA origin not resulting in significant stenosis. Left carotid system: Patent with prominent calcified plaque in the carotid bulb resulting in less than 50% stenosis. Vertebral arteries: Patent with the right being moderately dominant. Calcified plaque at the right vertebral artery origin results in moderate to severe stenosis. Skeleton: Moderately advanced disc degeneration in the mid to lower cervical spine. Other neck: No evidence of acute abnormality or mass. Upper chest: Pleural-parenchymal scarring in the right greater than left lung apices with bronchiectasis on the right. Partially visualized patchy to confluent ground-glass opacity in the superior segment of the right lower lobe. Review of the MIP images confirms the above findings CTA HEAD FINDINGS Anterior circulation: The internal carotid arteries are patent from skull base to carotid termini.  There are mild right and moderate left cavernous stenoses. There are also moderate left paraclinoid and mild left ICA terminus stenoses, and there is mild ectasia of the mid to distal left supraclinoid ICA. The MCAs are patent without evidence of proximal branch occlusion. There is mild proximal left M1 stenosis, and there are bilateral MCA branch vessel atherosclerotic changes including moderate proximal left M2 stenoses. The right ACA is patent with up to mild A1 and proximal A2 stenoses. The left A1 segment is small with a severe stenosis proximally. The left ACA is occluded just distal to the origin of the orbitofrontal branch. Posterior circulation: The intracranial vertebral arteries are patent with the right supplying the basilar. The left vertebral artery terminates in PICA. Patent right PICA and bilateral SCA origins are also visualized. The basilar artery is patent with mild diffuse irregular narrowing. Posterior communicating arteries are diminutive or absent. PCAs are patent with diffuse atherosclerotic irregularity and moderate multifocal narrowing involving the P2 segments and more distal branches. No aneurysm is identified. Venous sinuses: Chronic occlusion or delayed filling of the left sigmoid sinus. Anatomic variants: Left vertebral artery terminates in PICA. Delayed phase: No abnormal enhancement. Review of the MIP images confirms the above findings IMPRESSION: 1. Left A2 occlusion. 2. Widespread intracranial atherosclerosis including mild right and moderate left ICA stenoses, severe left A1 stenosis, and moderate M2 and P2 stenoses. 3. Moderate to severe right vertebral artery origin stenosis. 4. Cervical carotid artery atherosclerosis without significant stenosis. 5.  Aortic Atherosclerosis (ICD10-I70.0). Electronically Signed   By: Sebastian Ache M.D.   On: 11/14/2018 10:12   Ct Head Code Stroke Wo Contrast  Result Date: 11/14/2018 CLINICAL DATA:  Code stroke. Initial evaluation for acute  dizziness. EXAM: CT HEAD WITHOUT CONTRAST TECHNIQUE: Contiguous axial images were obtained from the base of the skull through the vertex without intravenous contrast. COMPARISON:  Prior CT from 12/04/2016. FINDINGS: Brain: Age-related cerebral atrophy with mild chronic small vessel ischemic disease. No acute intracranial hemorrhage. No acute large vessel territory infarct. No mass lesion, midline shift or mass effect. No hydrocephalus. Chronic right cerebellar infarct noted. Vascular: No hyperdense vessel. Scattered vascular calcifications noted within the carotid siphons. Skull: Scalp soft tissues and calvarium within normal limits. Sinuses/Orbits: Globes and orbital soft tissues within normal limits. Paranasal sinuses and mastoid air cells are clear. Other: None. ASPECTS [facility] Stroke Program Early CT Score) - Ganglionic level infarction (caudate, lentiform nuclei, internal capsule, insula, M1-M3 cortex): 7 - Supraganglionic infarction (M4-M6 cortex): 3 Total score (0-10 with 10 being normal): 10 IMPRESSION: 1. No acute intracranial abnormality. 2. ASPECTS is 10. 3. Chronic right cerebellar infarct. Critical Value/emergent results were called by telephone at the time of interpretation on 11/14/2018 at 2:28 am to Dr. Shaune Pollack , who verbally acknowledged these results. Electronically Signed   By: Rise Mu M.D.   On: 11/14/2018 02:28    Assessment: 67 y.o. male with PMH of HTN, DM, HLD, CAD/MI s/p stent in 2008 on ASA and plavix presented with episodes of vertigo nausea, and vomited once, but short lasting. Telestroke consulted and CT showed no acute abnormality but chronic right cerebellar infarct. NIHSS = 0. Given the relationship with positional change, short lasting and no neuro deficit, it is more concerning for peripheral vertigo. However, given sudden onset symptoms, with multiple stroke risk factors and CT chronic cerebellar infarct, he had subsequent stroke work up. CTA head and neck  showed diffuse intracranial stenosis, MRI no acute infarct. TTE pending. LDL 45 and A1C 6.3. Dix-Hallpike negative.   Taken together, pt symptoms most likely BPPV. Recommend outpt PT referral for [facility] training. Pt does have several risk factors for stroke, recommend to continue antiplatelet and statin for stroke prevention.   Stroke Risk Factors - diabetes mellitus, hyperlipidemia, hypertension and CAD s/p stenting  Plan: - TTE pending to finish stroke work up - resume home ASA 325 and lipitor  - Risk factor modification - outpt PT referral for BPPV maneuver training - meclizine PRN - PT/OT while in hospital - PCP follow up  Thank you for this consultation and allowing [place] to participate in the care of this patient. No neuro follow up needed at this time.  Marvel Plan, MD PhD Stroke Neurology 11/14/2018 2:59 PM

## 2018-11-14 NOTE — Plan of Care (Signed)
Discussed with Dr. Shaune Pollack at [facility]  Mr. Jerry Porter is a 67 y/o male who pmh HTN, HLD, DM, MI presented with c/o of dizziness and nausea while at work. Patient presented as a code stroke and was evaluated by telemetry neurology.  CT imaging showed chronic right cerebellar infarct.  Patient was not a TPA candidate.  Labs revealed CBC within normal limits,   sodium 128, potassium 3.2, chloride 94, CO2 21, BUN 45, creatinine 1.95.  Reported to have nystagmus on physical exam.   Symptoms suspected to be secondary to dehydration, but neurology recommended admission for MRI and further work-up.  Patient was given 2 L of fluid, 40 mEq of potassium chloride, and Zofran with symptoms reportedly improving.  Accepted as observation to a telemetry bed.

## 2018-11-14 NOTE — Progress Notes (Signed)
  Echocardiogram 2D Echocardiogram has been performed.  Jerry Porter 11/14/2018, 5:17 PM

## 2018-11-14 NOTE — Consult Note (Signed)
[facility] TeleNeurology Consult Services   Date of Service:   11/14/2018 02:11:45  Impression:     .  Vertigo  Comments: Patient is a 67 years old man who presents to the ED c/o vertigo that started suddenly while at work. Symptoms are now somewhat improved. Neurologic exam is non focal. Non contrast CTH showed no acute changes. In the absence of focal deficits, etiology of presentation is probably peripheral vertigo. Recommend admission for further work-up.  Metrics: Last Known Well: 11/13/2018 23:55:00 [facility] Notification Time: 11/14/2018 02:11:05 Arrival Time: 11/14/2018 00:37:00 Stamp Time: 11/14/2018 02:11:45 Time First Login Attempt: 11/14/2018 02:15:05 Video Start Time: 11/14/2018 02:15:05  Symptoms: Dizziness and nausea NIHSS Start Assessment Time: 11/14/2018 02:30:00 Patient is not a candidate for tPA. Patient was not deemed candidate for tPA thrombolytics because of non focal exam. Video End Time: 11/14/2018 02:39:24  CT head showed no acute hemorrhage or acute core infarct.  Advanced imaging was not obtained as the presentation was not suggestive of Large Vessel Occlusive Disease.   ER Physician notified of the decision on thrombolytics management on 11/14/2018 02:39:25  Our recommendations are outlined below.  Recommendations:     .  Activate Stroke Protocol Admission/Order Set     .  Stroke/Telemetry Floor     .  Neuro Checks     .  Bedside Swallow Eval     .  DVT Prophylaxis     .  IV Fluids, Normal Saline     .  Head of Bed Below 30 Degrees     .  Euglycemia and Avoid Hyperthermia (PRN Acetaminophen)     .  Antiplatelet Therapy Recommended  Routine Consultation with Inhouse Neurology for Follow up Care  Sign Out:     .  Discussed with Emergency Department Provider    ------------------------------------------------------------------------------  History of Present Illness: Patient is a 67 year old Male.  Patient  was brought by EMS for symptoms of Dizziness and nausea  Patient is a 67 years old man with history of HTN, HLD who presents to the ED c/o dizziness and nausea. Patient was at work and was getting ready to clock out when he stoop up and suddenly got very dizzy. They put him in the car and drove him home however, he was unable to get out of the car. He was also very nauseous. Vertigo is worse with movement. He denies any other associated symptoms. Prior history of vertigo.  CT head showed no acute hemorrhage or acute core infarct.  Last seen normal was within 4.5 hours. There is no history of hemorrhagic complications or intracranial hemorrhage. There is no history of Recent Anticoagulants. There is no history of recent major surgery. There is no history of recent stroke.  Examination: BP(111/89), Blood Glucose(89) 1A: Level of Consciousness - Alert; keenly responsive + 0 1B: Ask Month and Age - Both Questions Right + 0 1C: Blink Eyes & Squeeze Hands - Performs Both Tasks + 0 2: Test Horizontal Extraocular Movements - Normal + 0 3: Test Visual Fields - No Visual Loss + 0 4: Test Facial Palsy (Use Grimace if Obtunded) - Normal symmetry + 0 5A: Test Left Arm Motor Drift - No Drift for 10 Seconds + 0 5B: Test Right Arm Motor Drift - No Drift for 10 Seconds + 0 6A: Test Left Leg Motor Drift - No Drift for 5 Seconds + 0 6B: Test Right Leg Motor Drift - No Drift for 5 Seconds + 0 7: Test Limb Ataxia (  FNF/Heel-Shin) - No Ataxia + 0 8: Test Sensation - Normal; No sensory loss + 0 9: Test Language/Aphasia - Normal; No aphasia + 0 10: Test Dysarthria - Normal + 0 11: Test Extinction/Inattention - No abnormality + 0  NIHSS Score: 0  Patient was informed the Neurology Consult would happen via TeleHealth consult by way of interactive audio and video telecommunications and consented to receiving care in this manner.  Due to the immediate potential for life-threatening deterioration due to  underlying acute neurologic illness, I spent 35 minutes providing critical care. This time includes time for face to face visit via telemedicine, review of medical records, imaging studies and discussion of findings with providers, the patient and/or family.   Dr Vira Agar   [facility] [phone]  Case [id number]

## 2018-11-14 NOTE — ED Notes (Signed)
No TPA to be given per Dr. Felipa Furnace telecart Neuro exam complete

## 2018-11-14 NOTE — Progress Notes (Signed)
Patient arrived to the unit, has been oriented to the floor and patient is resting comfortably.

## 2018-11-14 NOTE — ED Notes (Signed)
Pt back from CT Nuerologist on telecart exam begun with arabic interpretor remotely

## 2018-11-14 NOTE — ED Provider Notes (Addendum)
[facility] EMERGENCY DEPARTMENT Provider Note   CSN: [id number] Arrival date & time: 11/14/18  8412   An emergency department physician performed an initial assessment on this suspected stroke patient at 0130.  History   Chief Complaint Chief Complaint  Patient presents with  . Dizziness    HPI Jerry Porter is a 67 y.o. male.  HPI 68 year old male with history of hypertension, high cholesterol, MI, diabetes, here with acute onset of dizziness.  The patient states that he was at work today.  He does admit he has been feeling somewhat unwell over the last several days due to cold-like symptoms.  However, he was not dizzy.  He sat down and was getting ready to clock out when he became acutely dizzy.  He felt like the room was spinning.  He had difficulty walking.  He thought it was his blood pressure so he sat back down.  However, symptoms persisted for the next hour so subsequent presents for evaluation.  He states he feels like the room is spinning and that he is off balance.  Son notes that his speech is somewhat different.  He denies any new focal numbness or weakness.  He denies a history of similar symptoms, though he does have meclizine on his medication list.  Denies any ringing in his ears.  No pain.  Past Medical History:  Diagnosis Date  . Diabetes mellitus without complication (HCC)   . High cholesterol   . Hypertension   . Myocardial infarct [facility])     Patient Active Problem List   Diagnosis Date Noted  . Vertigo 11/14/2018    Past Surgical History:  Procedure Laterality Date  . CATARACT EXTRACTION    . CORONARY ANGIOPLASTY WITH STENT PLACEMENT    . HERNIA REPAIR          Home Medications    Prior to Admission medications   Medication Sig Start Date End Date Taking? Authorizing Provider  amLODipine (NORVASC) 10 MG tablet Take 10 mg by mouth daily.    [provider]  aspirin 325 MG tablet Take 325 mg by mouth daily.    [provider]  atorvastatin (LIPITOR) 80 MG tablet Take 80 mg by mouth daily.    [provider]  glipiZIDE (GLUCOTROL) 5 MG tablet Take by mouth daily before breakfast.    [provider]  losartan-hydrochlorothiazide (HYZAAR) 100-25 MG tablet Take 1 tablet by mouth daily.    [provider]  meclizine (ANTIVERT) 25 MG tablet Take 1 tablet (25 mg total) by mouth 3 (three) times daily as needed for dizziness. 12/05/16   Zadie Rhine, MD  metoprolol succinate (TOPROL-XL) 50 MG 24 hr tablet Take 50 mg by mouth daily. Take with or immediately following a meal.    [provider]    Family History History reviewed. No pertinent family history.  Social History Social History   Tobacco Use  . Smoking status: Former Games developer  . Smokeless tobacco: Never Used  Substance Use Topics  . Alcohol use: No  . Drug use: No     Allergies   Patient has no known allergies.   Review of Systems Review of Systems  Constitutional: Negative for chills, fatigue and fever.  HENT: Negative for congestion and rhinorrhea.   Eyes: Negative for visual disturbance.  Respiratory: Negative for cough, shortness of breath and wheezing.   Cardiovascular: Negative for chest pain and leg swelling.  Gastrointestinal: Negative for abdominal pain, diarrhea, nausea and vomiting.  Genitourinary: Negative for dysuria and flank pain.  Musculoskeletal: Negative for neck pain and neck stiffness.  Skin: Negative for rash and wound.  Allergic/Immunologic: Negative for immunocompromised state.  Neurological: Positive for dizziness and speech difficulty. Negative for syncope, weakness and headaches.  All other systems reviewed and are negative.    Physical Exam Updated Vital Signs BP 132/63   Pulse 78   Temp 98.2 F (36.8 C)   Resp 20   Ht 6' (1.829 m)   Wt 72.6 kg   SpO2 100%   BMI 21.70 kg/m   Physical Exam  Constitutional: He is oriented to person, place, and time. He  appears well-developed and well-nourished. No distress.  HENT:  Head: Normocephalic and atraumatic.  Mouth/Throat: Oropharynx is clear and moist.  Eyes: Conjunctivae are normal.  Neck: Neck supple.  Cardiovascular: Normal rate, regular rhythm and normal heart sounds. Exam reveals no friction rub.  No murmur heard. Pulmonary/Chest: Effort normal and breath sounds normal. No respiratory distress. He has no wheezes. He has no rales.  Abdominal: He exhibits no distension.  Musculoskeletal: He exhibits no edema.  Neurological: He is alert and oriented to person, place, and time. He exhibits normal muscle tone.  Skin: Skin is warm. Capillary refill takes less than 2 seconds.  Psychiatric: He has a normal mood and affect.  Nursing note and vitals reviewed.   Neurological Exam:  Mental Status: Alert and oriented to person, place, and time. Attention and concentration normal. Speech clear. Recent memory is intact. Cranial Nerves: Visual fields grossly intact. EOMI and PERRLA. Left-beatinng nystagmus noted. Facial sensation intact at forehead, maxillary cheek, and chin/mandible bilaterally. No facial asymmetry or weakness. Hearing grossly normal. Uvula is midline, and palate elevates symmetrically. Normal SCM and trapezius strength. Tongue midline without fasciculations. Motor: Muscle strength 5/5 in proximal and distal UE and LE bilaterally. No pronator drift. Muscle tone normal. Reflexes: 2+ and symmetrical in all four extremities.  Sensation: Intact to light touch in upper and lower extremities distally bilaterally.  Gait:Deferred Coordination: Ataxia on left FTN.   ED Treatments / Results  Labs (all labs ordered are listed, but only abnormal results are displayed) Labs Reviewed  COMPREHENSIVE METABOLIC PANEL - Abnormal; Notable for the following components:      Result Value   Sodium 128 (*)    Potassium 3.2 (*)    Chloride 94 (*)    CO2 21 (*)    BUN 45 (*)    Creatinine, Ser 1.95  (*)    Total Bilirubin 1.3 (*)    GFR calc non Af Amer 35 (*)    GFR calc Af Amer 40 (*)    All other components within normal limits  LIPASE, BLOOD  CBC  ETHANOL  PROTIME-INR  APTT  DIFFERENTIAL  TROPONIN I  CBC  URINALYSIS, ROUTINE W REFLEX MICROSCOPIC  RAPID URINE DRUG SCREEN, HOSP PERFORMED  CBG MONITORING, ED    EKG EKG Interpretation  Date/Time:  Friday November 14 2018 01:46:19 EST Ventricular Rate:  69 PR Interval:    QRS Duration: 93 QT Interval:  417 QTC Calculation: 447 R Axis:   77 Text Interpretation:  Sinus rhythm RAE, consider biatrial enlargement No significant change since last tracing Confirmed by Shaune Pollack [phone]) on 11/14/2018 4:43:15 AM   Radiology Ct Head Code Stroke Wo Contrast  Result Date: 11/14/2018 CLINICAL DATA:  Code stroke. Initial evaluation for acute dizziness. EXAM: CT HEAD WITHOUT CONTRAST TECHNIQUE: Contiguous axial images were obtained from the base of the skull  through the vertex without intravenous contrast. COMPARISON:  Prior CT from 12/04/2016. FINDINGS: Brain: Age-related cerebral atrophy with mild chronic small vessel ischemic disease. No acute intracranial hemorrhage. No acute large vessel territory infarct. No mass lesion, midline shift or mass effect. No hydrocephalus. Chronic right cerebellar infarct noted. Vascular: No hyperdense vessel. Scattered vascular calcifications noted within the carotid siphons. Skull: Scalp soft tissues and calvarium within normal limits. Sinuses/Orbits: Globes and orbital soft tissues within normal limits. Paranasal sinuses and mastoid air cells are clear. Other: None. ASPECTS [facility] Stroke Program Early CT Score) - Ganglionic level infarction (caudate, lentiform nuclei, internal capsule, insula, M1-M3 cortex): 7 - Supraganglionic infarction (M4-M6 cortex): 3 Total score (0-10 with 10 being normal): 10 IMPRESSION: 1. No acute intracranial abnormality. 2. ASPECTS is 10. 3. Chronic right cerebellar  infarct. Critical Value/emergent results were called by telephone at the time of interpretation on 11/14/2018 at 2:28 am to Dr. Shaune Pollack , who verbally acknowledged these results. Electronically Signed   By: Rise Mu M.D.   On: 11/14/2018 02:28    Procedures .Critical Care Performed by: Shaune Pollack, MD Authorized by: Shaune Pollack, MD   Critical care provider statement:    Critical care time (minutes):  35   Critical care time was exclusive of:  Separately billable procedures and treating other patients and teaching time   Critical care was necessary to treat or prevent imminent or life-threatening deterioration of the following conditions:  Cardiac failure, circulatory failure, metabolic crisis and CNS failure or compromise   Critical care was time spent personally by me on the following activities:  Development of treatment plan with patient or surrogate, discussions with consultants, evaluation of patient's response to treatment, examination of patient, obtaining history from patient or surrogate, ordering and performing treatments and interventions, ordering and review of laboratory studies, ordering and review of radiographic studies, pulse oximetry, re-evaluation of patient's condition and review of old charts   I assumed direction of critical care for this patient from another provider in my specialty: no     (including critical care time)  Medications Ordered in ED Medications  lactated ringers bolus 1,000 mL (1,000 mLs Intravenous New Bag/Given 11/14/18 0344)  0.9 %  sodium chloride infusion (has no administration in time range)  ondansetron (ZOFRAN) injection 4 mg (4 mg Intravenous Given 11/14/18 0116)  sodium chloride 0.9 % bolus 1,000 mL (0 mLs Intravenous Stopped 11/14/18 0247)  potassium chloride SA (K-DUR,KLOR-CON) CR tablet 40 mEq (40 mEq Oral Given 11/14/18 0345)     Initial Impression / Assessment and Plan / ED Course  I have reviewed the triage vital  signs and the nursing notes.  Pertinent labs & imaging results that were available during my care of the patient were reviewed by me and considered in my medical decision making (see chart for details).     67 year old male here with dizziness.  I suspect this could be secondary to peripheral vertigo given position allergy, but he has significant risk factors as well as reported speech slurring.  Patient activated as a code stroke after my evaluation.  CT head negative.  Tele-neurology team has evaluated and is in agreement, recommend stroke evaluation as an inpatient.  Of note, CMP does show possible acute on chronic kidney injury and will give fluids, potassium replacement. Dr. Wilford Corner aware.  Final Clinical Impressions(s) / ED Diagnoses   Final diagnoses:  Stroke-like symptoms    ED Discharge Orders    None       Ladene Allocca,  Sheria Lang, MD 11/14/18 [id number]    Shaune Pollack, MD 11/14/18 1191    Shaune Pollack, MD 11/14/18 [phone]

## 2018-11-14 NOTE — ED Notes (Signed)
Report to [facility] 

## 2018-11-14 NOTE — ED Notes (Signed)
Patient transported to CT 

## 2018-11-14 NOTE — ED Notes (Signed)
ED Provider at bedside. 

## 2018-11-14 NOTE — ED Notes (Signed)
Telestroke cart in room with RN assist.

## 2018-11-14 NOTE — ED Notes (Signed)
Call to CT for code stroke by Charge RN

## 2018-11-14 NOTE — ED Notes (Signed)
Pt transferred to [facility] via [facility] 

## 2018-11-14 NOTE — ED Triage Notes (Addendum)
Pt c/o dizziness/ nausea  x 1 hr while at work

## 2018-11-15 ENCOUNTER — Observation Stay (HOSPITAL_COMMUNITY): Payer: Managed Care, Other (non HMO)

## 2018-11-15 DIAGNOSIS — R42 Dizziness and giddiness: Secondary | ICD-10-CM | POA: Diagnosis not present

## 2018-11-15 LAB — GLUCOSE, CAPILLARY: Glucose-Capillary: 111 mg/dL — ABNORMAL HIGH (ref 70–99)

## 2018-11-15 LAB — HIV ANTIBODY (ROUTINE TESTING W REFLEX): HIV Screen 4th Generation wRfx: NONREACTIVE

## 2018-11-15 MED ORDER — ASPIRIN EC 81 MG PO TBEC: 81.0000 mg | DELAYED_RELEASE_TABLET | Freq: Every day | ORAL | 2 refills | Status: DC

## 2018-11-15 MED ORDER — CLOPIDOGREL BISULFATE 75 MG PO TABS: 75.0000 mg | ORAL_TABLET | Freq: Every day | ORAL | 3 refills | Status: DC

## 2018-11-15 NOTE — Progress Notes (Signed)
Referral placed to [facility] for outpatient vestibular therapy

## 2018-11-15 NOTE — Discharge Summary (Signed)
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 male with a PMH of HTN, HLD, DM, CAD s/p MI who presents with a complaint of the acute onset of dizziness and nausea while at work and associated with positional changes. Vertigo symptoms worse with movement.  Had an episode of vertigo 1 year ago.  Associated symptoms include an episode of nausea and vomiting last night, and recent URI symptoms including a cough.  Also reports pain in his right shoulder and left upper back.  ED Course:  Noted to have nystagmus on exam. The patient underwent a CT which showed a chronic right cerebellar infarct.Evaluated by neurologist with no reports of focal neurological deficits. Patient was not a TPA candidate. MRI ordered. Labs significant for sodium 128, potassium 3.2, chloride 94, CO2 21, BUN 45, creatinine 1.95. Patient was given 2 L of fluid, 40 mEq of potassium chloride, and Zofran with symptoms reportedly improving.   Subsequently admitted, neurology was consulted: CVA was ruled out, electrolytes were repleted.  Patient was seen and evaluated by PT/OT.  Symptoms has much improved.  Patient was cleared with discharge, patient's home medication of  lisinopril/HCTZ was DC'd.  We will discharge summary  Vertigo --- likely due to dehydration, electrode abnormality, viral upper respiratory infection exacerbated by nausea or vomiting.  Sudden onset of symptoms consistent with acute vertigo, and it appears that this likely has been triggered by a URI.  Initial CT screening negative (personally reviewed) although it does show some evidence of prior cerebrovascular disease as pictured below on the right, raising the concern that this could be a TIA or stroke.  Neurology has been consulted.  Neuro exam nonfocal although noted to have some nystagmus on presentation.   [facility] neurologist recommended aspirin and Plavix, since the work-up is negative I recommend to resume aspirin at 325 mg p.o. daily stroke work-up has been initiated with MRI and 2D echocardiogram>>> completed all within normal limits old findings on MRI, normal echocardiogram with normal ejection fraction    Active Problems:    Benign essential HTN -Blood pressure stable on current Norvasc, HCTZ/lisinopril will be DC'd due to electrode abnormalities hyponatremia, hypokalemia which has resolved and improved continue beta-blockers     CAD (coronary artery disease) Has been placed on aspirin and Plavix.  Continue beta-blocker.  Continue statin.    HLD (hyperlipidemia) Continue statin.    LDL within goal 45    Diabetes mellitus type 2, controlled, with  renal complications (HCC) Patient has controlled diabetes on glipizide at baseline.  Check hemoglobin A1c.  6.3   Hypokalemia Likely from diuretic use.  D/C diuretics and improved with supplement potassium in IV fluids.    Hyponatremia Likely from diuretic use.  D/C diuretics and improved hydrate.    AKI (acute kidney injury) (HCC) Baseline creatinine appears to be around 1.1 >>1.95 >>> today 1.38.   Possible BPPV Patient had no symptoms with PT -Further recommendations  Body mass index is 21.68 kg/m.  Other  information:   Code Status: Full code. Family Communication:  No Family member present at bedside, findings were discussed with the patient in detail. Disposition Plan: To be discharged home  No evidence of stroke, intense likely history of dehydration, GI and upper respiratory infection, nausea vomiting.  Symptoms is much improved. Consults called: Neurology. Admission status: Observation.  Consultations: Neurology  Procedures: No admission procedures for hospital encounter.     Discharge Instructions:   Discharge Instructions    Activity as tolerated - No restrictions   Complete by:  As directed    Ambulatory referral to Physical Therapy   Complete by:  As directed    Vestibular PT   Diet - low sodium heart healthy   Complete by:  As directed    Discharge instructions   Complete by:  As directed    Follow-up with the PCP in 1 to 2 weeks   Increase activity slowly   Complete by:  As directed        Medication List    STOP taking these medications   losartan-hydrochlorothiazide 100-25 MG tablet Commonly known as:  HYZAAR     TAKE these medications   amLODipine 10 MG tablet Commonly known as:  NORVASC Take 10 mg by mouth daily.   aspirin 325 MG tablet Take 325 mg by mouth daily.   atorvastatin 80 MG tablet Commonly known as:  LIPITOR Take 80 mg by mouth daily.   glipiZIDE 5 MG tablet Commonly known as:  GLUCOTROL Take by mouth daily before breakfast.   meclizine 25 MG tablet Commonly known as:  ANTIVERT Take 1 tablet (25 mg total) by mouth 3 (three) times daily as needed for dizziness.   metoprolol succinate 50 MG 24 hr tablet Commonly known as:  TOPROL-XL Take 150 mg by mouth daily. Take with or immediately following a meal.      Follow-up Information    [facility] Follow up.   Specialty:  Rehabilitation Why:  they will be in contact with you to schedule your vestibular therapy at the office this week.   Contact information: [address] [id number] mc [place] [address] [phone]         No Known Allergies   Procedures /Studies:   Ct Angio Head W Or Wo Contrast  Result Date: 11/14/2018 CLINICAL DATA:  Vertigo. EXAM: CT ANGIOGRAPHY HEAD AND NECK TECHNIQUE: Multidetector CT imaging of the head and neck was performed using the standard protocol during bolus administration of intravenous contrast. Multiplanar CT image reconstructions and MIPs were obtained to evaluate the vascular anatomy. Carotid stenosis measurements (when applicable) are obtained utilizing NASCET criteria, using the distal internal carotid diameter as the denominator. CONTRAST:  60mL ISOVUE-370 IOPAMIDOL (ISOVUE-370) INJECTION 76% COMPARISON:  Noncontrast head CT 11/14/2018. Head MRV 02/12/2005. No prior arterial imaging. FINDINGS: CTA NECK FINDINGS Aortic arch: Standard 3 vessel aortic arch with mild atherosclerotic plaque. Widely patent arch vessel origins. Right carotid system: Patent with calcified  plaque at the ICA origin not resulting in significant stenosis. Left carotid system: Patent with prominent calcified plaque in the carotid bulb resulting in less than 50% stenosis. Vertebral arteries: Patent with the right being moderately dominant. Calcified plaque at the right vertebral artery origin results in moderate to severe stenosis. Skeleton: Moderately advanced disc degeneration in the mid to lower cervical spine. Other neck: No evidence of acute abnormality or mass. Upper chest: Pleural-parenchymal scarring in the right greater than left lung apices with bronchiectasis on the right. Partially visualized patchy to confluent ground-glass opacity in the superior segment of the right lower lobe. Review of the MIP images confirms the above findings CTA HEAD FINDINGS Anterior circulation: The internal carotid arteries are patent from skull base to carotid termini. There are mild right and moderate left  cavernous stenoses. There are also moderate left paraclinoid and mild left ICA terminus stenoses, and there is mild ectasia of the mid to distal left supraclinoid ICA. The MCAs are patent without evidence of proximal branch occlusion. There is mild proximal left M1 stenosis, and there are bilateral MCA branch vessel atherosclerotic changes including moderate proximal left M2 stenoses. The right ACA is patent with up to mild A1 and proximal A2 stenoses. The left A1 segment is small with a severe stenosis proximally. The left ACA is occluded just distal to the origin of the orbitofrontal branch. Posterior circulation: The intracranial vertebral arteries are patent with the right supplying the basilar. The left vertebral artery terminates in PICA. Patent right PICA and bilateral SCA origins are also visualized. The basilar artery is patent with mild diffuse irregular narrowing. Posterior communicating arteries are diminutive or absent. PCAs are patent with diffuse atherosclerotic irregularity and moderate multifocal narrowing involving the P2 segments and more distal branches. No aneurysm is identified. Venous sinuses: Chronic occlusion or delayed filling of the left sigmoid sinus. Anatomic variants: Left vertebral artery terminates in PICA. Delayed phase: No abnormal enhancement. Review of the MIP images confirms the above findings IMPRESSION: 1. Left A2 occlusion. 2. Widespread intracranial atherosclerosis including mild right and moderate left ICA stenoses, severe left A1 stenosis, and moderate M2 and P2 stenoses. 3. Moderate to severe right vertebral artery origin stenosis. 4. Cervical carotid artery atherosclerosis without significant stenosis. 5.  Aortic Atherosclerosis (ICD10-I70.0). Electronically Signed   By: Sebastian Ache M.D.   On: 11/14/2018 10:12   Dg Chest 2 View  Result Date: 11/15/2018 CLINICAL DATA:  Cough.  Nausea.  Dizziness. EXAM: CHEST - 2 VIEW COMPARISON:  None. FINDINGS: The heart size and  mediastinal contours are within normal limits. Pleural-parenchymal scarring seen in the right lung apex and right lower lobe. No evidence of pulmonary infiltrate or edema. No evidence of pleural effusion. IMPRESSION: No active cardiopulmonary disease. Electronically Signed   By: Myles Rosenthal M.D.   On: 11/15/2018 13:11   Ct Angio Neck W Or Wo Contrast  Result Date: 11/14/2018 CLINICAL DATA:  Vertigo. EXAM: CT ANGIOGRAPHY HEAD AND NECK TECHNIQUE: Multidetector CT imaging of the head and neck was performed using the standard protocol during bolus administration of intravenous contrast. Multiplanar CT image reconstructions and MIPs were obtained to evaluate the vascular anatomy. Carotid stenosis measurements (when applicable) are obtained utilizing NASCET criteria, using the distal internal carotid diameter as the denominator. CONTRAST:  60mL ISOVUE-370 IOPAMIDOL (ISOVUE-370) INJECTION 76% COMPARISON:  Noncontrast head CT 11/14/2018. Head MRV 02/12/2005. No prior arterial imaging. FINDINGS: CTA NECK FINDINGS Aortic arch: Standard 3 vessel aortic arch with mild atherosclerotic plaque. Widely patent  arch vessel origins. Right carotid system: Patent with calcified plaque at the ICA origin not resulting in significant stenosis. Left carotid system: Patent with prominent calcified plaque in the carotid bulb resulting in less than 50% stenosis. Vertebral arteries: Patent with the right being moderately dominant. Calcified plaque at the right vertebral artery origin results in moderate to severe stenosis. Skeleton: Moderately advanced disc degeneration in the mid to lower cervical spine. Other neck: No evidence of acute abnormality or mass. Upper chest: Pleural-parenchymal scarring in the right greater than left lung apices with bronchiectasis on the right. Partially visualized patchy to confluent ground-glass opacity in the superior segment of the right lower lobe. Review of the MIP images confirms the above findings CTA  HEAD FINDINGS Anterior circulation: The internal carotid arteries are patent from skull base to carotid termini. There are mild right and moderate left cavernous stenoses. There are also moderate left paraclinoid and mild left ICA terminus stenoses, and there is mild ectasia of the mid to distal left supraclinoid ICA. The MCAs are patent without evidence of proximal branch occlusion. There is mild proximal left M1 stenosis, and there are bilateral MCA branch vessel atherosclerotic changes including moderate proximal left M2 stenoses. The right ACA is patent with up to mild A1 and proximal A2 stenoses. The left A1 segment is small with a severe stenosis proximally. The left ACA is occluded just distal to the origin of the orbitofrontal branch. Posterior circulation: The intracranial vertebral arteries are patent with the right supplying the basilar. The left vertebral artery terminates in PICA. Patent right PICA and bilateral SCA origins are also visualized. The basilar artery is patent with mild diffuse irregular narrowing. Posterior communicating arteries are diminutive or absent. PCAs are patent with diffuse atherosclerotic irregularity and moderate multifocal narrowing involving the P2 segments and more distal branches. No aneurysm is identified. Venous sinuses: Chronic occlusion or delayed filling of the left sigmoid sinus. Anatomic variants: Left vertebral artery terminates in PICA. Delayed phase: No abnormal enhancement. Review of the MIP images confirms the above findings IMPRESSION: 1. Left A2 occlusion. 2. Widespread intracranial atherosclerosis including mild right and moderate left ICA stenoses, severe left A1 stenosis, and moderate M2 and P2 stenoses. 3. Moderate to severe right vertebral artery origin stenosis. 4. Cervical carotid artery atherosclerosis without significant stenosis. 5.  Aortic Atherosclerosis (ICD10-I70.0). Electronically Signed   By: Sebastian Ache M.D.   On: 11/14/2018 10:12   Mr  Brain Wo Contrast  Result Date: 11/14/2018 CLINICAL DATA:  Acute onset of dizziness and nausea associated with positional changes. EXAM: MRI HEAD WITHOUT CONTRAST TECHNIQUE: Multiplanar, multiecho pulse sequences of the brain and surrounding structures were obtained without intravenous contrast. COMPARISON:  Head CT and CTA 11/14/2018.  Head MRV 02/12/2005. FINDINGS: Brain: There is no evidence of acute infarct, intracranial hemorrhage, mass, midline shift, or extra-axial fluid collection. A chronic, moderate-sized right cerebellar infarct is again noted. There is also a tiny chronic left cerebellar infarct. Scattered cerebral white matter T2 hyperintensities are not greater than expected for age. There's mild cerebral atrophy. Vascular: Major intracranial arterial flow voids are preserved. T2 hyperintensity in the left sigmoid sinus and left jugular bulb may reflect chronic slow flow or occlusion. Skull and upper cervical spine: No suspicious marrow lesion. Degenerative endplate changes at C4-5. Sinuses/Orbits: Left cataract extraction. Small left maxillary sinus mucous retention cyst. At most trace left mastoid fluid. Other: None. IMPRESSION: 1. No acute intracranial abnormality. 2. Chronic cerebellar infarcts. Electronically Signed   By: Sebastian Ache  M.D.   On: 11/14/2018 15:11   Ct Head Code Stroke Wo Contrast  Result Date: 11/14/2018 CLINICAL DATA:  Code stroke. Initial evaluation for acute dizziness. EXAM: CT HEAD WITHOUT CONTRAST TECHNIQUE: Contiguous axial images were obtained from the base of the skull through the vertex without intravenous contrast. COMPARISON:  Prior CT from 12/04/2016. FINDINGS: Brain: Age-related cerebral atrophy with mild chronic small vessel ischemic disease. No acute intracranial hemorrhage. No acute large vessel territory infarct. No mass lesion, midline shift or mass effect. No hydrocephalus. Chronic right cerebellar infarct noted. Vascular: No hyperdense vessel. Scattered  vascular calcifications noted within the carotid siphons. Skull: Scalp soft tissues and calvarium within normal limits. Sinuses/Orbits: Globes and orbital soft tissues within normal limits. Paranasal sinuses and mastoid air cells are clear. Other: None. ASPECTS Navos Stroke Program Early CT Score) - Ganglionic level infarction (caudate, lentiform nuclei, internal capsule, insula, M1-M3 cortex): 7 - Supraganglionic infarction (M4-M6 cortex): 3 Total score (0-10 with 10 being normal): 10 IMPRESSION: 1. No acute intracranial abnormality. 2. ASPECTS is 10. 3. Chronic right cerebellar infarct. Critical Value/emergent results were called by telephone at the time of interpretation on 11/14/2018 at 2:28 am to Dr. Shaune Pollack , who verbally acknowledged these results. Electronically Signed   By: Rise Mu M.D.   On: 11/14/2018 02:28    Subjective:   Patient was seen and examined 11/15/2018, 4:57 PM Patient stable today. No acute distress.  No issues overnight Stable for discharge.  Discharge Exam:    Vitals:   11/15/18 0021 11/15/18 0354 11/15/18 0755 11/15/18 1200  BP: (!) 114/59 (!) 111/54 128/60 124/60  Pulse: 66 (!) 58 62   Resp: 16 14 17    Temp: 98 F (36.7 C) 98.4 F (36.9 C) 98.5 F (36.9 C) 98 F (36.7 C)  TempSrc: Oral Oral Oral Oral  SpO2: 98% 99% 98% 100%  Weight:      Height:        General: Pt lying comfortably in bed & appears in no obvious distress. Cardiovascular: S1 & S2 heard, RRR, S1/S2 +. No murmurs, rubs, gallops or clicks. No JVD or pedal edema. Respiratory: Clear to auscultation without wheezing, rhonchi or crackles. No increased work of breathing. Abdominal:  Non-distended, non-tender & soft. No organomegaly or masses appreciated. Normal bowel sounds heard. CNS: Alert and oriented. No focal deficits. Extremities: no edema, no cyanosis    The results of significant diagnostics from this hospitalization (including imaging, microbiology, ancillary and  laboratory) are listed below for reference.      Microbiology:   No results found for this or any previous visit (from the past 240 hour(s)).   Labs:   CBC: Recent Labs  Lab 11/14/18 0112  WBC 6.8  6.6  NEUTROABS 4.3  HGB 13.4  13.5  HCT 40.2  40.6  MCV 81.2  81.0  PLT 197  182   Basic Metabolic Panel: Recent Labs  Lab 11/14/18 0112 11/14/18 1019  NA 128* 133*  K 3.2* 4.3  CL 94* 98  CO2 21* 21*  GLUCOSE 98 197*  BUN 45* 25*  CREATININE 1.95* 1.38*  CALCIUM 9.0 9.0  PHOS  --  1.9*   Liver Function Tests: Recent Labs  Lab 11/14/18 0112 11/14/18 1019  AST 36  --   ALT 26  --   ALKPHOS 57  --   BILITOT 1.3*  --   PROT 8.0  --   ALBUMIN 4.1 3.5   BNP (last 3 results) No results for input(s):  BNP in the last 8760 hours. Cardiac Enzymes: Recent Labs  Lab 11/14/18 0112  TROPONINI <0.03   CBG: Recent Labs  Lab 11/14/18 0605 11/14/18 1100 11/14/18 1651 11/14/18 2151 11/15/18 0608  GLUCAP 87 225* 148* 145* 111*   Hgb A1c Recent Labs    11/14/18 1019  HGBA1C 6.3*   Lipid Profile Recent Labs    11/14/18 1019  CHOL 90  HDL 34*  LDLCALC 45  TRIG 57  CHOLHDL 2.6   Thyroid function studies Recent Labs    11/14/18 1019  TSH 1.434   Anemia work up Recent Labs    11/14/18 1019  VITAMINB12 975*   Urinalysis    Component Value Date/Time   COLORURINE COLORLESS (A) 11/14/2018 0500   APPEARANCEUR CLEAR 11/14/2018 0500   LABSPEC <1.005 (L) 11/14/2018 0500   PHURINE 7.0 11/14/2018 0500   GLUCOSEU NEGATIVE 11/14/2018 0500   HGBUR TRACE (A) 11/14/2018 0500   BILIRUBINUR NEGATIVE 11/14/2018 0500   KETONESUR 15 (A) 11/14/2018 0500   PROTEINUR NEGATIVE 11/14/2018 0500   NITRITE NEGATIVE 11/14/2018 0500   LEUKOCYTESUR NEGATIVE 11/14/2018 0500    Time coordinating discharge: Over 45  minutes  SIGNED: Kendell Bane, MD, FACP, FHM. [facility],  Pager [phone]  If 7PM-7AM, please contact  night-coverage [email or web address] 11/15/2018, 4:57 PM

## 2018-11-15 NOTE — Progress Notes (Signed)
NURSING PROGRESS NOTE  Jerry Porter [id number] Discharge Data: 11/15/2018 7:54 PM Attending Provider: No att. providers found [id number], No Pcp Per     Riley Churches to be D/C'd Home per MD order.  Discussed with the patient the After Visit Summary and all questions fully answered. All IV's discontinued with no bleeding noted. All belongings returned to patient for patient to take home.   Last Vital Signs:  Blood pressure 124/60, pulse 62, temperature 98.7 F (37.1 C), temperature source Oral, resp. rate 17, height 6' (1.829 m), weight 72.5 kg, SpO2 100 %.  Discharge Medication List Allergies as of 11/15/2018   No Known Allergies     Medication List    STOP taking these medications   losartan-hydrochlorothiazide 100-25 MG tablet Commonly known as:  HYZAAR     TAKE these medications   amLODipine 10 MG tablet Commonly known as:  NORVASC Take 10 mg by mouth daily.   aspirin 325 MG tablet Take 325 mg by mouth daily.   atorvastatin 80 MG tablet Commonly known as:  LIPITOR Take 80 mg by mouth daily.   glipiZIDE 5 MG tablet Commonly known as:  GLUCOTROL Take by mouth daily before breakfast.   meclizine 25 MG tablet Commonly known as:  ANTIVERT Take 1 tablet (25 mg total) by mouth 3 (three) times daily as needed for dizziness.   metoprolol succinate 50 MG 24 hr tablet Commonly known as:  TOPROL-XL Take 150 mg by mouth daily. Take with or immediately following a meal.

## 2018-11-15 NOTE — Evaluation (Signed)
Physical Therapy Evaluation Patient Details Name: Jerry Porter MRN: [id number] DOB: 09-04-1951 Today's Date: 11/15/2018   History of Present Illness  Zaivion D Gilday is a 67 y.o. African American male with PMH of HTN, DM, HLD, CAD/MI s/p stent in 2008 on ASA and plavix presented with sudden onset dizziness and vertigo off work on standing up. He also felt nausea. Not able to get out of his car when driving home. MRI reveals chronic cerebellar infarcts.    Clinical Impression  Pt admitted with above diagnosis. Pt currently with functional limitations due to the deficits listed below (see PT Problem List). Pt was able to ambulate without device and without assist with good balance overall.  Pt scored 23/24 on DGI.  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  Initiated x 1 exercise and recommend f/u at Outpt PT for vestibular therapy.  Will follow acutely. Pt will benefit from skilled PT to increase their independence and safety with mobility to allow discharge to the venue listed below.     Follow Up Recommendations Outpatient PT;Supervision - Intermittent(for vestibular rehab)    Equipment Recommendations  None recommended by PT    Recommendations for Other Services       Precautions / Restrictions Precautions Precautions: Fall Restrictions Weight Bearing Restrictions: No      Mobility  Bed Mobility Overal bed mobility: Independent                Transfers Overall transfer level: Independent                  Ambulation/Gait Ambulation/Gait assistance: Supervision Gait Distance (Feet): 300 Feet Assistive device: None Gait Pattern/deviations: Step-through pattern;Decreased stride length   Gait velocity interpretation: >2.62 ft/sec, indicative of community ambulatory General Gait Details: Pt states he feels he is back to baseline.  Pt had no difficulty walking without device and was steady.  Pt was able to withstand challenges to balance.     Stairs            Wheelchair Mobility    Modified Rankin (Stroke Patients Only)       Balance Overall balance assessment: Needs assistance Sitting-balance support: No upper extremity supported;Feet supported Sitting balance-Leahy Scale: Normal     Standing balance support: No upper extremity supported;During functional activity Standing balance-Leahy Scale: Good                   Standardized Balance Assessment Standardized Balance Assessment : Dynamic Gait Index   Dynamic Gait Index Level Surface: Normal Change in Gait Speed: Normal Gait with Horizontal Head Turns: Normal Gait with Vertical Head Turns: Normal Gait and Pivot Turn: Normal Step Over Obstacle: Normal Step Around Obstacles: Normal Steps: Mild Impairment Total Score: 23       Pertinent Vitals/Pain Pain Assessment: No/denies pain    Home Living Family/patient expects to be discharged to:: Private residence Living Arrangements: Children(states daughter and family stays with him and son as well) Available Help at Discharge: Family;Available 24 hours/day Type of Home: House Home Access: Stairs to enter Entrance Stairs-Rails: None Entrance Stairs-Number of Steps: 1 Home Layout: One level Home Equipment: Cane - single point Additional Comments: Pt drives and works    Prior Function Level of Independence: Independent               Higher education careers adviser        Extremity/Trunk Assessment   Upper Extremity Assessment Upper Extremity Assessment: Defer to OT evaluation    Lower Extremity  Assessment Lower Extremity Assessment: Overall WFL for tasks assessed    Cervical / Trunk Assessment Cervical / Trunk Assessment: Normal  Communication   Communication: Prefers language other than English(Speaks Arabic)  Cognition Arousal/Alertness: Awake/alert Behavior During Therapy: WFL for tasks assessed/performed Overall Cognitive Status: Within Functional Limits for tasks assessed                                         General Comments General comments (skin integrity, edema, etc.): Scored 23/24 on DGI.  Suggests low risk of falls.  Pt does appear to have a right hypofunction of vestibular system.  Only elicited symptoms with high level activity.  Tested negative for BPPV all canals.  Possibly had a neuritis that has resolved fairly well.      Exercises Other Exercises Other Exercises: x1 exercises   Assessment/Plan    PT Assessment Patient needs continued PT services  PT Problem List Decreased mobility;Decreased knowledge of use of DME;Decreased safety awareness;Decreased knowledge of precautions;Decreased activity tolerance;Decreased balance       PT Treatment Interventions DME instruction;Gait training;Functional mobility training;Therapeutic activities;Therapeutic exercise;Balance training;Patient/family education(gaze stabilization)    PT Goals (Current goals can be found in the Care Plan section)  Acute Rehab PT Goals Patient Stated Goal: to get better PT Goal Formulation: With patient Time For Goal Achievement: 11/29/18 Potential to Achieve Goals: Good    Frequency Min 3X/week   Barriers to discharge        Co-evaluation               AM-PAC PT "6 Clicks" Mobility  Outcome Measure Help needed turning from your back to your side while in a flat bed without using bedrails?: None Help needed moving from lying on your back to sitting on the side of a flat bed without using bedrails?: None Help needed moving to and from a bed to a chair (including a wheelchair)?: None Help needed standing up from a chair using your arms (e.g., wheelchair or bedside chair)?: None Help needed to walk in hospital room?: A Little Help needed climbing 3-5 steps with a railing? : A Little 6 Click Score: 22    End of Session Equipment Utilized During Treatment: Gait belt Activity Tolerance: Patient tolerated treatment well Patient left: in chair;with  call bell/phone within reach;with chair alarm set Nurse Communication: Mobility status PT Visit Diagnosis: Muscle weakness (generalized) (M62.81);Dizziness and giddiness (R42)    Time: 1610-9604 PT Time Calculation (min) (ACUTE ONLY): 25 min   Charges:   PT Evaluation $PT Eval Moderate Complexity: 1 Mod PT Treatments $Gait Training: 8-22 mins        Tecia Cinnamon,PT Acute Rehabilitation Services Pager:  [phone]  Office:  [phone]    Berline Lopes 11/15/2018, 10:09 AM

## 2018-11-15 NOTE — Evaluation (Addendum)
Occupational Therapy Evaluation Patient Details Name: Jerry Porter MRN: [id number] DOB: 1951-01-28 Today's Date: 11/15/2018    History of Present Illness Jerry Porter is a 67 y.o. male with PMH of HTN, DM, HLD, CAD/MI s/p stent in 2008 on ASA and plavix presented with sudden onset dizziness and vertigo off work on standing up. He also felt nausea. Not able to get out of his car when driving home. MRI reveals chronic cerebellar infarcts.     Clinical Impression   PTA, pt was living with his wife and son and was independent and working at [employer]. Pt currently performing near baseline function for ADLs at supervision level. Pt very agreeable to therapy. Denies dizziness during ADLs and functional mobility. Pt would benefit from further acute OT to facilitate safe dc. Recommend dc to home once medically stable per physician.     Follow Up Recommendations  No OT follow up;Supervision - Intermittent    Equipment Recommendations  None recommended by OT    Recommendations for Other Services       Precautions / Restrictions Precautions Precautions: Fall Restrictions Weight Bearing Restrictions: No      Mobility Bed Mobility Overal bed mobility: Independent                Transfers Overall transfer level: Independent                    Balance Overall balance assessment: Needs assistance Sitting-balance support: No upper extremity supported;Feet supported Sitting balance-Leahy Scale: Normal     Standing balance support: No upper extremity supported;During functional activity Standing balance-Leahy Scale: Good                           ADL either performed or assessed with clinical judgement   ADL Overall ADL's : Needs assistance/impaired                                       General ADL Comments: Pt performing ADLs at supervision level. Pt performing LB dressing, simulated toilet transfer, bed mobility, and tub  transfer.     Vision         Perception     Praxis      Pertinent Vitals/Pain Pain Assessment: No/denies pain     Hand Dominance     Extremity/Trunk Assessment Upper Extremity Assessment Upper Extremity Assessment: Overall WFL for tasks assessed   Lower Extremity Assessment Lower Extremity Assessment: Defer to PT evaluation   Cervical / Trunk Assessment Cervical / Trunk Assessment: Normal   Communication Communication Communication: Prefers language other than English(Speaks Arabic)   Cognition Arousal/Alertness: Awake/alert Behavior During Therapy: WFL for tasks assessed/performed Overall Cognitive Status: Within Functional Limits for tasks assessed                                     General Comments  Pt appears at baseline function. Unsure of cognition due to ESL and pt without hearing aides. Pt very agreeable to therapy and daughter calling during begining of session to confirm information    Exercises     Shoulder Instructions      Home Living Family/patient expects to be discharged to:: Private residence Living Arrangements: Children(states daughter and family stays with him and son as well) Available Help  at Discharge: Family;Available 24 hours/day Type of Home: House Home Access: Stairs to enter [employer] of Steps: 1 Entrance Stairs-Rails: None Home Layout: One level     Bathroom Shower/Tub: Chief Strategy Officer: Standard     Home Equipment: Cane - single point   Additional Comments: Pt drives and works      Prior Functioning/Environment Level of Independence: Independent        Comments: Works at [employer] in environmental services        OT Problem List: Decreased strength;Decreased activity tolerance;Impaired balance (sitting and/or standing);Decreased knowledge of use of DME or AE;Decreased knowledge of precautions      OT Treatment/Interventions: Self-care/ADL  training;Therapeutic exercise;Energy conservation;DME and/or AE instruction;Therapeutic activities;Patient/family education    OT Goals(Current goals can be found in the care plan section) Acute Rehab OT Goals Patient Stated Goal: To retire soon OT Goal Formulation: With patient Time For Goal Achievement: 11/29/18 Potential to Achieve Goals: Good ADL Goals Pt Will Perform Grooming: Independently;standing Pt Will Perform Lower Body Dressing: Independently;sit to/from stand Pt Will Transfer to Toilet: Independently;ambulating;regular height toilet  OT Frequency: Min 2X/week   Barriers to D/C:            Co-evaluation              AM-PAC OT "6 Clicks" Daily Activity     Outcome Measure Help from another person eating meals?: None Help from another person taking care of personal grooming?: None Help from another person toileting, which includes using toliet, bedpan, or urinal?: None Help from another person bathing (including washing, rinsing, drying)?: None Help from another person to put on and taking off regular upper body clothing?: None Help from another person to put on and taking off regular lower body clothing?: None 6 Click Score: 24   End of Session Equipment Utilized During Treatment: Gait belt Nurse Communication: Mobility status  Activity Tolerance: Patient tolerated treatment well Patient left: in chair;with call bell/phone within reach;with chair alarm set  OT Visit Diagnosis: Unsteadiness on feet (R26.81);Other abnormalities of gait and mobility (R26.89);Muscle weakness (generalized) (M62.81)                Time: 6213-0865 OT Time Calculation (min): 21 min Charges:  OT General Charges $OT Visit: 1 Visit OT Evaluation $OT Eval Moderate Complexity: 1 Mod  Jerry Porter MSOT, OTR/L Acute Rehab Pager: [phone] Office: [phone]  Theodoro Grist Eliya Bubar 11/15/2018, 5:00 PM

## 2018-11-17 LAB — RPR: RPR Ser Ql: REACTIVE — AB

## 2018-11-17 LAB — RPR, QUANT+TP ABS (REFLEX)
Rapid Plasma Reagin, Quant: 1:1 {titer} — ABNORMAL HIGH
T Pallidum Abs: REACTIVE — AB

## 2018-11-18 ENCOUNTER — Encounter: Payer: Self-pay | Admitting: Internal Medicine

## 2018-11-18 NOTE — Progress Notes (Signed)
While reviewing lab work in overdue results, I came across a + RPR/titer.  I called the phone # listed in Epic for Adolph Pollack, NP, which was not accurate.  They directed me to another number which was also not accurate. I called the 3rd # and was on hold for 10 minutes before I hung up.  I spoke with Dr. Ninetta Lights who recommended I contact the Health Department to have the disease intervention specialist track down the patient and do contact tracing. They are sending over a fax for me to fill out (not here yet).  I faxed the d/c summary and a cover letter to Adolph Pollack, NP to express the need to bring the patient in for follow up. Finally, I called the patient's daughter and told her that the patient needed to be brought back to his provider to discuss a positive test result that may require additional treatment.  Hillery Aldo, MD 11/18/2018 10:34 AM

## 2018-12-19 ENCOUNTER — Encounter (HOSPITAL_COMMUNITY): Payer: Self-pay | Admitting: Emergency Medicine

## 2018-12-19 ENCOUNTER — Other Ambulatory Visit: Payer: Self-pay

## 2018-12-19 ENCOUNTER — Emergency Department (HOSPITAL_COMMUNITY): Payer: Managed Care, Other (non HMO)

## 2018-12-19 ENCOUNTER — Emergency Department (HOSPITAL_COMMUNITY)
Admission: EM | Admit: 2018-12-19 | Discharge: 2018-12-19 | Disposition: A | Discharge status: Home / Self Care | Payer: Managed Care, Other (non HMO) | Attending: Emergency Medicine | Admitting: Emergency Medicine

## 2018-12-19 DIAGNOSIS — Z7982 Long term (current) use of aspirin: Secondary | ICD-10-CM | POA: Insufficient documentation

## 2018-12-19 DIAGNOSIS — Z87891 Personal history of nicotine dependence: Secondary | ICD-10-CM | POA: Insufficient documentation

## 2018-12-19 DIAGNOSIS — Z7902 Long term (current) use of antithrombotics/antiplatelets: Secondary | ICD-10-CM | POA: Diagnosis not present

## 2018-12-19 DIAGNOSIS — R1110 Vomiting, unspecified: Secondary | ICD-10-CM | POA: Insufficient documentation

## 2018-12-19 DIAGNOSIS — I2510 Atherosclerotic heart disease of native coronary artery without angina pectoris: Secondary | ICD-10-CM | POA: Diagnosis not present

## 2018-12-19 DIAGNOSIS — I10 Essential (primary) hypertension: Secondary | ICD-10-CM | POA: Diagnosis not present

## 2018-12-19 DIAGNOSIS — I252 Old myocardial infarction: Secondary | ICD-10-CM | POA: Insufficient documentation

## 2018-12-19 DIAGNOSIS — Z79899 Other long term (current) drug therapy: Secondary | ICD-10-CM | POA: Insufficient documentation

## 2018-12-19 DIAGNOSIS — E7800 Pure hypercholesterolemia, unspecified: Secondary | ICD-10-CM | POA: Insufficient documentation

## 2018-12-19 DIAGNOSIS — E119 Type 2 diabetes mellitus without complications: Secondary | ICD-10-CM | POA: Diagnosis not present

## 2018-12-19 LAB — CBC WITH DIFFERENTIAL/PLATELET
Abs Immature Granulocytes: 0.02 10*3/uL (ref 0.00–0.07)
Basophils Absolute: 0 10*3/uL (ref 0.0–0.1)
Basophils Relative: 0 %
Eosinophils Absolute: 0.1 10*3/uL (ref 0.0–0.5)
Eosinophils Relative: 2 %
HCT: 44.7 % (ref 39.0–52.0)
Hemoglobin: 14.7 g/dL (ref 13.0–17.0)
Immature Granulocytes: 0 %
Lymphocytes Relative: 21 %
Lymphs Abs: 1.6 10*3/uL (ref 0.7–4.0)
MCH: 27.5 pg (ref 26.0–34.0)
MCHC: 32.9 g/dL (ref 30.0–36.0)
MCV: 83.6 fL (ref 80.0–100.0)
Monocytes Absolute: 0.7 10*3/uL (ref 0.1–1.0)
Monocytes Relative: 9 %
NEUTROS ABS: 5 10*3/uL (ref 1.7–7.7)
Neutrophils Relative %: 68 %
Platelets: 165 10*3/uL (ref 150–400)
RBC: 5.35 MIL/uL (ref 4.22–5.81)
RDW: 15.2 % (ref 11.5–15.5)
WBC: 7.4 10*3/uL (ref 4.0–10.5)
nRBC: 0 % (ref 0.0–0.2)

## 2018-12-19 LAB — COMPREHENSIVE METABOLIC PANEL
ALT: 25 U/L (ref 0–44)
AST: 37 U/L (ref 15–41)
Albumin: 4.6 g/dL (ref 3.5–5.0)
Alkaline Phosphatase: 60 U/L (ref 38–126)
Anion gap: 11 (ref 5–15)
BUN: 23 mg/dL (ref 8–23)
CO2: 24 mmol/L (ref 22–32)
Calcium: 9.7 mg/dL (ref 8.9–10.3)
Chloride: 103 mmol/L (ref 98–111)
Creatinine, Ser: 1.18 mg/dL (ref 0.61–1.24)
GFR calc Af Amer: >60 mL/min (ref >60)
GFR calc non Af Amer: >60 mL/min (ref >60)
Glucose, Bld: 122 mg/dL — ABNORMAL HIGH (ref 70–99)
Potassium: 4 mmol/L (ref 3.5–5.1)
Sodium: 138 mmol/L (ref 135–145)
Total Bilirubin: 1.6 mg/dL — ABNORMAL HIGH (ref 0.3–1.2)
Total Protein: 7.9 g/dL (ref 6.5–8.1)

## 2018-12-19 LAB — LIPASE, BLOOD: Lipase: 40 U/L (ref 11–51)

## 2018-12-19 LAB — TROPONIN I: Troponin I: <0.03 ng/mL (ref <0.03)

## 2018-12-19 LAB — CBG MONITORING, ED: Glucose-Capillary: 125 mg/dL — ABNORMAL HIGH (ref 70–99)

## 2018-12-19 MED ORDER — ONDANSETRON HCL 4 MG PO TABS: 4.0000 mg | ORAL_TABLET | Freq: Three times a day (TID) | ORAL | 0 refills | Status: AC | PRN

## 2018-12-19 NOTE — ED Triage Notes (Signed)
C/O of emesis that woke pt up around 1100. Pt states he vomited 3 times. Pt denies pain.

## 2018-12-19 NOTE — ED Notes (Signed)
Patient verbalizes understanding of discharge instructions. Opportunity for questioning and answers were provided. Armband removed by staff, pt discharged from ED in wheelchair.  

## 2018-12-19 NOTE — ED Provider Notes (Signed)
[name] Provider Note   CSN: [id number] Arrival date & time: 12/19/18  1506     History   Chief Complaint Chief Complaint  Patient presents with  . Emesis    HPI Jerry Porter is a 68 y.o. male.  HPI  Patient is a 68 year old male with a past medical history of HTN, HLD, DM, CAD s/p MI who presents accompanied by family for evaluation of 3 episodes of nonbloody nonbilious emesis.  Patient also notes some intermittent right-sided chest pain that is been present for several weeks but is not currently at present.  Patient denies any headache, vertigo, current chest pain, shortness of breath, current nausea, abdominal pain, diarrhea, dysuria, blood in stool, blood in his urine, abdominal pain, back pain, rash, focal extremity pain numbness or tingling, or other acute complaints.  He denies any recent travel out of [place] the past month as well as sick contacts.  Denies any recent changes to his diet or recent changes to medications.  Denies daily EtOH use or daily NSAID use.  Denies tobacco abuse or illicit drug use.  Denies prior similar episodes.  Denies alleviating or aggravating factors.    Past Medical History:  Diagnosis Date  . Diabetes mellitus without complication (HCC)   . High cholesterol   . Hypertension   . Myocardial infarct [facility](HCC)     Patient Active Problem List   Diagnosis Date Noted  . Vertigo 11/14/2018  . Benign essential HTN 11/14/2018  . CAD (coronary artery disease) 11/14/2018  . HLD (hyperlipidemia) 11/14/2018  . 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 Hypokalemia 11/14/2018  . Hyponatremia 11/14/2018  . AKI (acute kidney injury) (HCC) 11/14/2018    Past Surgical History:  Procedure Laterality Date  . CATARACT EXTRACTION    . CORONARY ANGIOPLASTY WITH STENT PLACEMENT    . HERNIA REPAIR          Home Medications    Prior to Admission medications   Medication Sig Start  Date End Date Taking? Authorizing Provider  amLODipine (NORVASC) 10 MG tablet Take 10 mg by mouth daily.   Yes [provider]  aspirin 81 MG tablet Take 81 mg by mouth daily.    Yes [provider]  atorvastatin (LIPITOR) 80 MG tablet Take 80 mg by mouth daily.   Yes [provider]  clopidogrel (PLAVIX) 75 MG tablet Take 75 mg by mouth daily.   Yes [provider]  glipiZIDE (GLUCOTROL) 5 MG tablet Take by mouth daily before breakfast.   Yes [provider]  losartan (COZAAR) 100 MG tablet Take 100 mg by mouth daily.   Yes [provider]  metoprolol succinate (TOPROL-XL) 50 MG 24 hr tablet Take 150 mg by mouth daily. Take with or immediately following a meal.    Yes [provider]  ondansetron (ZOFRAN) 4 MG tablet Take 1 tablet (4 mg total) by mouth every 8 (eight) hours as needed for nausea or vomiting. 12/19/18   Antoine PrimasSmith, Kyngston Pickelsimer, MD    Family History History reviewed. No pertinent family history.  Social History Social History   Tobacco Use  . Smoking status: Former Games developermoker  . Smokeless tobacco: Never Used  Substance Use Topics  . Alcohol use: No  . Drug use: No     Allergies   Patient has no known allergies.   Review of Systems Review of Systems  Constitutional: Negative for chills and fever.  HENT: Negative for ear  pain and sore throat.   Eyes: Negative for pain and visual disturbance.  Respiratory: Negative for cough and shortness of breath.   Cardiovascular: Positive for chest pain ( chronic, intermittent, not at presentation, not exertional or positional). Negative for palpitations.  Gastrointestinal: Positive for vomiting. Negative for abdominal pain.  Genitourinary: Negative for dysuria and hematuria.  Musculoskeletal: Negative for arthralgias and back pain.  Skin: Negative for color change and rash.  Neurological: Negative for seizures and syncope.  All other systems reviewed and are  negative.    Physical Exam Updated Vital Signs BP (!) 155/80 (BP Location: Right Arm)   Pulse (!) 58   Temp (!) 97.5 F (36.4 C) (Oral)   Resp 16   SpO2 100%   Physical Exam Vitals signs and nursing note reviewed.  Constitutional:      Appearance: Normal appearance. He is well-developed and normal weight.  HENT:     Head: Normocephalic and atraumatic.     Right Ear: External ear normal.     Left Ear: External ear normal.     Nose: Nose normal.     Mouth/Throat:     Mouth: Mucous membranes are moist.  Eyes:     Conjunctiva/sclera: Conjunctivae normal.  Neck:     Musculoskeletal: Neck supple.  Cardiovascular:     Rate and Rhythm: Normal rate and regular rhythm.     Heart sounds: No murmur.  Pulmonary:     Effort: Pulmonary effort is normal. No respiratory distress.     Breath sounds: Normal breath sounds.  Abdominal:     Palpations: Abdomen is soft.     Tenderness: There is no abdominal tenderness.  Skin:    General: Skin is warm and dry.     Capillary Refill: Capillary refill takes less than 2 seconds.  Neurological:     General: No focal deficit present.     Mental Status: He is alert.      ED Treatments / Results  Labs (all labs ordered are listed, but only abnormal results are displayed) Labs Reviewed  COMPREHENSIVE METABOLIC PANEL - Abnormal; Notable for the following components:      Result Value   Glucose, Bld 122 (*)    Total Bilirubin 1.6 (*)    All other components within normal limits  CBG MONITORING, ED - Abnormal; Notable for the following components:   Glucose-Capillary 125 (*)    All other components within normal limits  CBC WITH DIFFERENTIAL/PLATELET  LIPASE, BLOOD  TROPONIN I    EKG EKG Interpretation  Date/Time:  Friday December 19 2018 15:24:18 EST Ventricular Rate:  56 PR Interval:    QRS Duration: 85 QT Interval:  438 QTC Calculation: 423 R Axis:   80 Text Interpretation:  Sinus rhythm Minimal ST elevation, inferior leads  Confirmed by Benjiman Core [phone]) on 12/19/2018 3:46:51 PM   Radiology Dg Chest 2 View  Result Date: 12/19/2018 CLINICAL DATA:  Multiple episodes of vomiting since this morning EXAM: CHEST - 2 VIEW COMPARISON:  11/17/2018 FINDINGS: Normal heart size, mediastinal contours, and pulmonary vascularity. Biapical scarring slightly greater on RIGHT. Lungs otherwise clear. No infiltrate, pleural effusion, or pneumothorax. Bones unremarkable. IMPRESSION: No acute abnormalities. Electronically Signed   By: Ulyses Southward M.D.   On: 12/19/2018 16:44    Procedures Procedures (including critical care time)  Medications Ordered in ED Medications - No data to display   Initial Impression / Assessment and Plan / ED Course  I have reviewed the triage vital signs and  the nursing notes.  Pertinent labs & imaging results that were available during my care of the patient were reviewed by me and considered in my medical decision making (see chart for details).     Patient is a 68 year old male who presents with above-stated history exam.  On presentation patient is afebrile with a heart rate of 54 and otherwise stable vital signs on room air.  Low suspicion for ACS given patient denies any acute chest pain and ECG shows an NSR with a heart rate of 56, normal intervals, normal axis, no signs of acute ischemic change.  However given age and risk factors a troponin was obtained.  Troponin is undetectable.  CMP shows no significant electrolyte or metabolic abnormalities.  I have a low suspicion for hepatobiliary etiology given patient is nontender in his right upper quadrant and LFTs are WNL.  Doubt acute pancreatitis given history and lipase of 40.  CBC obtained that shows a WBC count of 7.4, hemoglobin 14.7, platelets 165.  Chest x-ray was obtained that showed no focal consolidation suggestive of pneumonia, no evidence of pneumothorax, no pulmonary edema or pleural effusion or other acute intrathoracic  findings.  History exam is not consistent with ACS, PE, aortic dissection, sepsis, meningitis, pneumonia, or other acute life-threatening etiology.  Patient discharged in stable condition.  Strict return precautions advised and discussed.  Patient given prescription for p.o. antiemetic and instructed to follow-up with PCP in 3 to 5 days.  Patient and patient's family voiced understanding agreement with this plan prior to discharge.  Final Clinical Impressions(s) / ED Diagnoses   Final diagnoses:  Vomiting, intractability of vomiting not specified, presence of nausea not specified, unspecified vomiting type    ED Discharge Orders         Ordered    ondansetron (ZOFRAN) 4 MG tablet  Every 8 hours PRN     12/19/18 1735           Antoine Primas, MD 12/19/18 4628    Benjiman Core, MD 12/20/18 [phone]

## 2018-12-19 NOTE — ED Notes (Signed)
CBG results of 125 reported to RN, Romeo Apple.

## 2020-09-17 IMAGING — MR MR HEAD W/O CM
1 series · 48 of 48 positions shown · non-contrast
Comparison: Head CT and CTA 11/14/2018.  Head MRV 02/12/2005.

CLINICAL DATA: Acute onset of dizziness and nausea associated with
positional changes.

EXAM:
MRI HEAD WITHOUT CONTRAST
TECHNIQUE: Multiplanar, multiecho pulse sequences of the brain and surrounding
structures were obtained without intravenous contrast.

[Series 4: DWI · axial · 3.0mm · 1.09mm/px · z∈[-88,+51]mm · 48 of 98 slices shown]
[im 1/98]
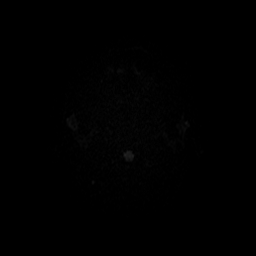
[im 3/98]
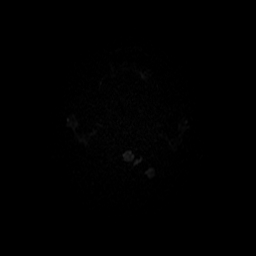
[im 5/98]
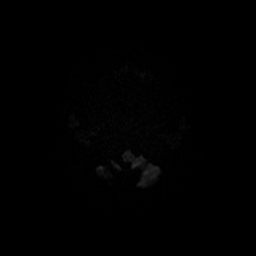
[im 7/98]
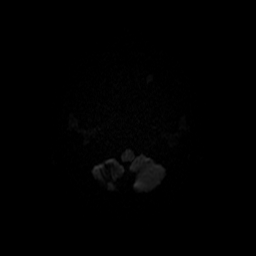
[im 9/98]
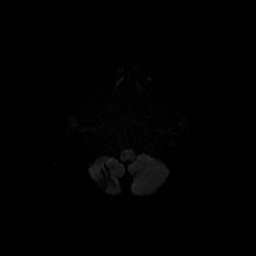
[im 11/98]
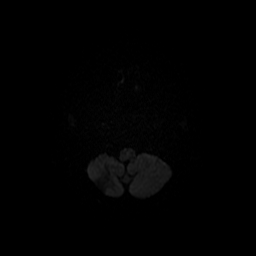
[im 13/98]
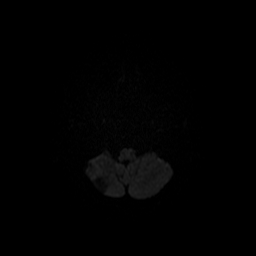
[im 15/98]
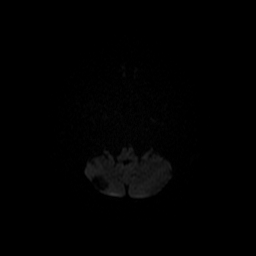
[im 17/98]
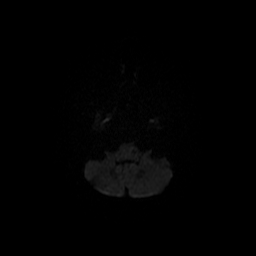
[im 19/98]
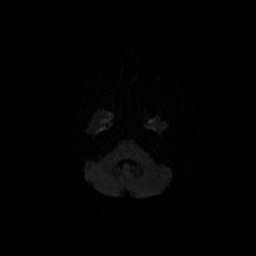
[im 21/98]
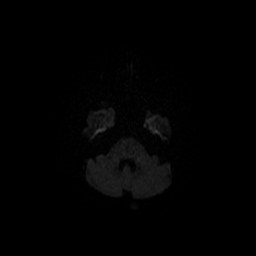
[im 23/98]
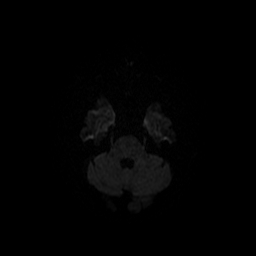
[im 25/98]
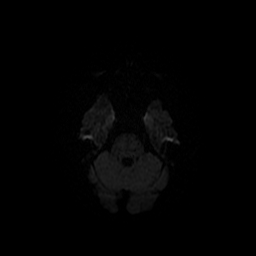
[im 27/98]
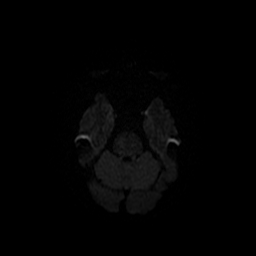
[im 29/98]
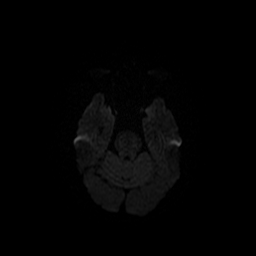
[im 31/98]
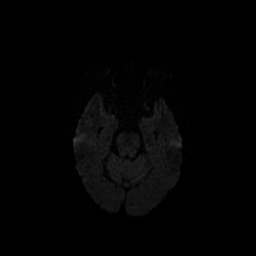
[im 34/98]
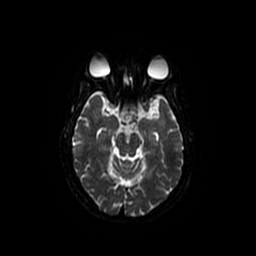
[im 36/98]
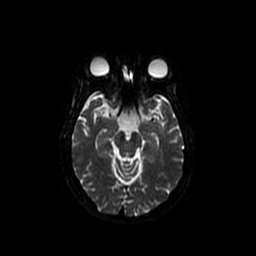
[im 38/98]
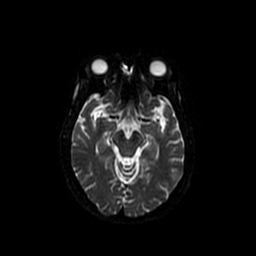
[im 40/98]
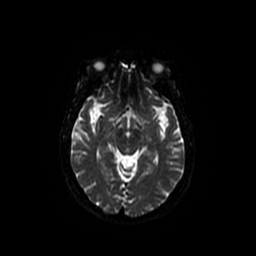
[im 42/98]
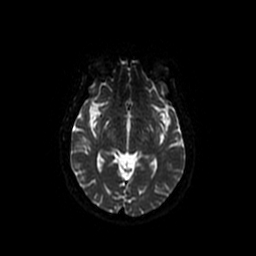
[im 44/98]
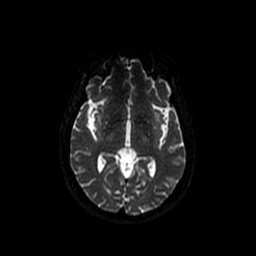
[im 46/98]
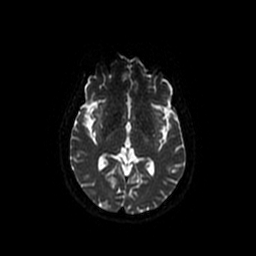
[im 48/98]
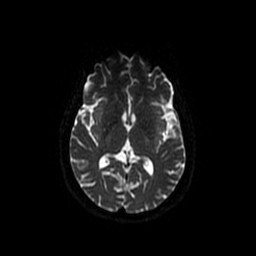
[im 50/98]
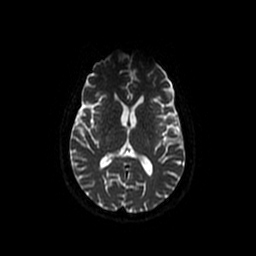
[im 52/98]
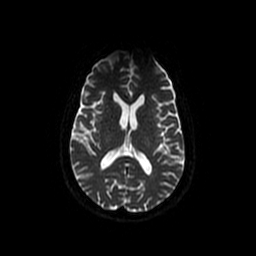
[im 54/98]
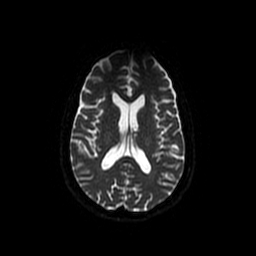
[im 56/98]
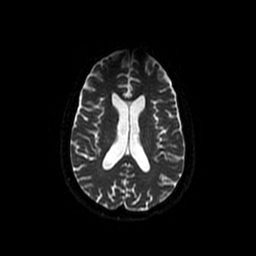
[im 58/98]
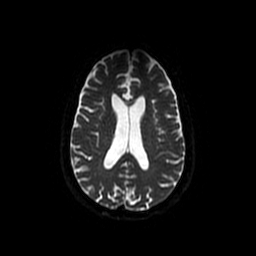
[im 60/98]
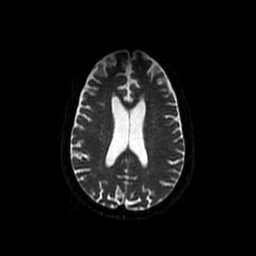
[im 62/98]
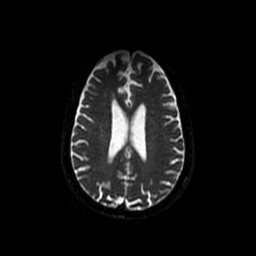
[im 64/98]
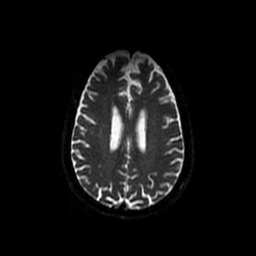
[im 67/98]
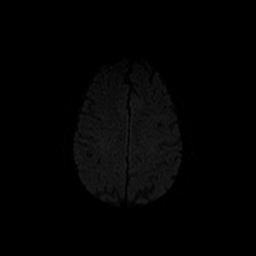
[im 69/98]
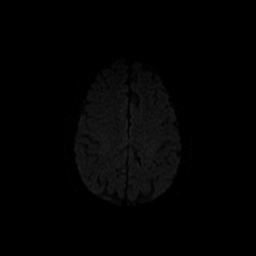
[im 71/98]
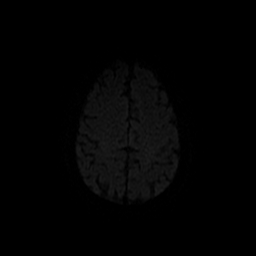
[im 73/98]
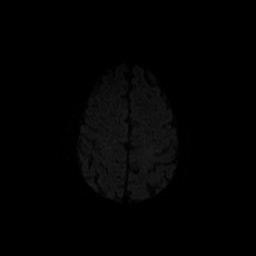
[im 75/98]
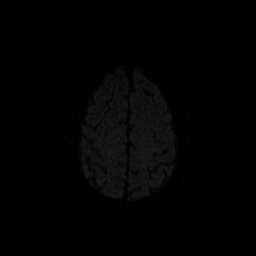
[im 77/98]
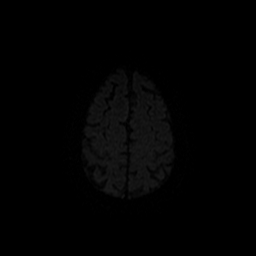
[im 79/98]
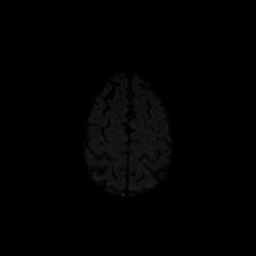
[im 81/98]
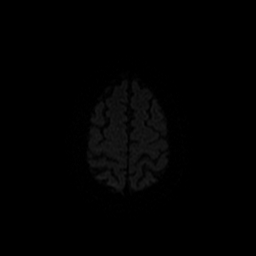
[im 83/98]
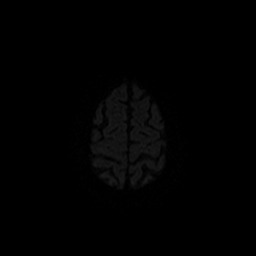
[im 85/98]
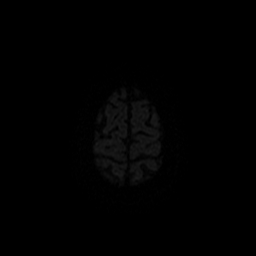
[im 87/98]
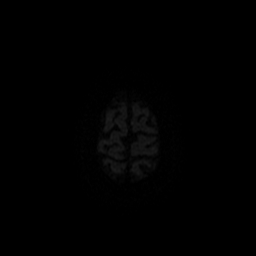
[im 89/98]
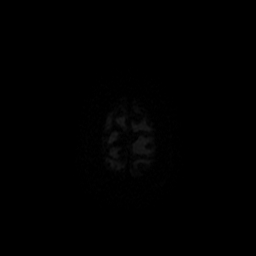
[im 91/98]
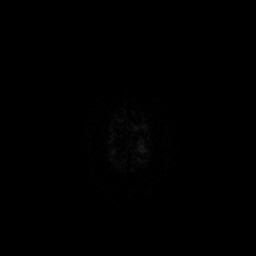
[im 93/98]
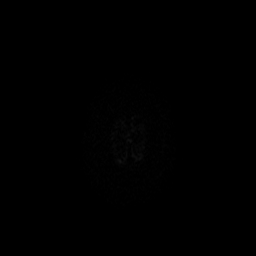
[im 95/98]
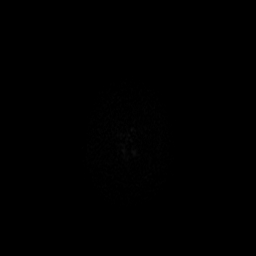
[im 98/98]
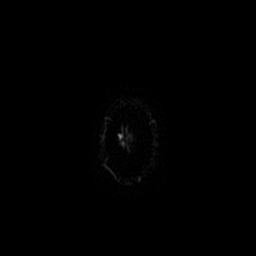

[48 of 48 positions shown; findings below may reference images not displayed]

FINDINGS: Brain: There is no evidence of acute infarct, intracranial
hemorrhage, mass, midline shift, or extra-axial fluid collection. A
chronic, moderate-sized right cerebellar infarct is again noted.
There is also a tiny chronic left cerebellar infarct. Scattered
cerebral white matter T2 hyperintensities are not greater than
expected for age. There's mild cerebral atrophy.

Vascular: Major intracranial arterial flow voids are preserved. T2
hyperintensity in the left sigmoid sinus and left jugular bulb may
reflect chronic slow flow or occlusion.

Skull and upper cervical spine: No suspicious marrow lesion.
Degenerative endplate changes at C4-5.

Sinuses/Orbits: Left cataract extraction. Small left maxillary sinus
mucous retention cyst. At most trace left mastoid fluid.

Other: None.
IMPRESSION: 1. No acute intracranial abnormality.
2. Chronic cerebellar infarcts.

## 2021-04-13 ENCOUNTER — Emergency Department (HOSPITAL_BASED_OUTPATIENT_CLINIC_OR_DEPARTMENT_OTHER)
Admission: EM | Admit: 2021-04-13 | Discharge: 2021-04-13 | Disposition: A | Discharge status: Home / Self Care | Payer: Medicare HMO | Attending: Emergency Medicine | Admitting: Emergency Medicine

## 2021-04-13 ENCOUNTER — Emergency Department (HOSPITAL_BASED_OUTPATIENT_CLINIC_OR_DEPARTMENT_OTHER): Payer: Medicare HMO

## 2021-04-13 ENCOUNTER — Other Ambulatory Visit: Payer: Self-pay

## 2021-04-13 ENCOUNTER — Encounter (HOSPITAL_BASED_OUTPATIENT_CLINIC_OR_DEPARTMENT_OTHER): Payer: Self-pay | Admitting: Emergency Medicine

## 2021-04-13 DIAGNOSIS — I10 Essential (primary) hypertension: Secondary | ICD-10-CM | POA: Insufficient documentation

## 2021-04-13 DIAGNOSIS — Z7982 Long term (current) use of aspirin: Secondary | ICD-10-CM | POA: Diagnosis not present

## 2021-04-13 DIAGNOSIS — E785 Hyperlipidemia, unspecified: Secondary | ICD-10-CM | POA: Insufficient documentation

## 2021-04-13 DIAGNOSIS — E1169 Type 2 diabetes mellitus with other specified complication: Secondary | ICD-10-CM | POA: Diagnosis not present

## 2021-04-13 DIAGNOSIS — Z79899 Other long term (current) drug therapy: Secondary | ICD-10-CM | POA: Diagnosis not present

## 2021-04-13 DIAGNOSIS — Z20822 Contact with and (suspected) exposure to covid-19: Secondary | ICD-10-CM | POA: Insufficient documentation

## 2021-04-13 DIAGNOSIS — E860 Dehydration: Secondary | ICD-10-CM | POA: Diagnosis not present

## 2021-04-13 DIAGNOSIS — R4182 Altered mental status, unspecified: Secondary | ICD-10-CM | POA: Diagnosis present

## 2021-04-13 DIAGNOSIS — I2510 Atherosclerotic heart disease of native coronary artery without angina pectoris: Secondary | ICD-10-CM | POA: Diagnosis not present

## 2021-04-13 DIAGNOSIS — Z7984 Long term (current) use of oral hypoglycemic drugs: Secondary | ICD-10-CM | POA: Diagnosis not present

## 2021-04-13 DIAGNOSIS — Z87891 Personal history of nicotine dependence: Secondary | ICD-10-CM | POA: Insufficient documentation

## 2021-04-13 LAB — CBC WITH DIFFERENTIAL/PLATELET
Abs Immature Granulocytes: 0.03 10*3/uL (ref 0.00–0.07)
Basophils Absolute: 0 10*3/uL (ref 0.0–0.1)
Basophils Relative: 0 %
Eosinophils Absolute: 0.1 10*3/uL (ref 0.0–0.5)
Eosinophils Relative: 2 %
HCT: 46.9 % (ref 39.0–52.0)
Hemoglobin: 15.8 g/dL (ref 13.0–17.0)
Immature Granulocytes: 1 %
Lymphocytes Relative: 25 %
Lymphs Abs: 1.6 10*3/uL (ref 0.7–4.0)
MCH: 27.6 pg (ref 26.0–34.0)
MCHC: 33.7 g/dL (ref 30.0–36.0)
MCV: 82 fL (ref 80.0–100.0)
Monocytes Absolute: 0.8 10*3/uL (ref 0.1–1.0)
Monocytes Relative: 12 %
Neutro Abs: 3.8 10*3/uL (ref 1.7–7.7)
Neutrophils Relative %: 60 %
Platelets: 174 10*3/uL (ref 150–400)
RBC: 5.72 MIL/uL (ref 4.22–5.81)
RDW: 14.2 % (ref 11.5–15.5)
WBC: 6.4 10*3/uL (ref 4.0–10.5)
nRBC: 0 % (ref 0.0–0.2)

## 2021-04-13 LAB — COMPREHENSIVE METABOLIC PANEL
ALT: 40 U/L (ref 0–44)
AST: 40 U/L (ref 15–41)
Albumin: 4 g/dL (ref 3.5–5.0)
Alkaline Phosphatase: 57 U/L (ref 38–126)
Anion gap: 10 (ref 5–15)
BUN: 29 mg/dL — ABNORMAL HIGH (ref 8–23)
CO2: 21 mmol/L — ABNORMAL LOW (ref 22–32)
Calcium: 9.8 mg/dL (ref 8.9–10.3)
Chloride: 102 mmol/L (ref 98–111)
Creatinine, Ser: 1.5 mg/dL — ABNORMAL HIGH (ref 0.61–1.24)
GFR, Estimated: 50 mL/min — ABNORMAL LOW (ref >60)
Glucose, Bld: 134 mg/dL — ABNORMAL HIGH (ref 70–99)
Potassium: 4.3 mmol/L (ref 3.5–5.1)
Sodium: 133 mmol/L — ABNORMAL LOW (ref 135–145)
Total Bilirubin: 0.9 mg/dL (ref 0.3–1.2)
Total Protein: 7.7 g/dL (ref 6.5–8.1)

## 2021-04-13 LAB — URINALYSIS, ROUTINE W REFLEX MICROSCOPIC
Bilirubin Urine: NEGATIVE
Glucose, UA: NEGATIVE mg/dL
Hgb urine dipstick: NEGATIVE
Ketones, ur: NEGATIVE mg/dL
Leukocytes,Ua: NEGATIVE
Nitrite: NEGATIVE
Protein, ur: NEGATIVE mg/dL
Specific Gravity, Urine: 1.01 (ref 1.005–1.030)
pH: 6 (ref 5.0–8.0)

## 2021-04-13 LAB — I-STAT VENOUS BLOOD GAS, ED
Acid-Base Excess: 3 mmol/L — ABNORMAL HIGH (ref 0.0–2.0)
Bicarbonate: 28 mmol/L (ref 20.0–28.0)
Calcium, Ion: 1.31 mmol/L (ref 1.15–1.40)
HCT: 49 % (ref 39.0–52.0)
Hemoglobin: 16.7 g/dL (ref 13.0–17.0)
O2 Saturation: 64 %
Potassium: 4.7 mmol/L (ref 3.5–5.1)
Sodium: 135 mmol/L (ref 135–145)
TCO2: 29 mmol/L (ref 22–32)
pCO2, Ven: 44.8 mmHg (ref 44.0–60.0)
pH, Ven: 7.404 (ref 7.250–7.430)
pO2, Ven: 33 mmHg (ref 32.0–45.0)

## 2021-04-13 LAB — RESP PANEL BY RT-PCR (FLU A&B, COVID) ARPGX2
Influenza A by PCR: NEGATIVE
Influenza B by PCR: NEGATIVE
SARS Coronavirus 2 by RT PCR: NEGATIVE

## 2021-04-13 LAB — CBG MONITORING, ED: Glucose-Capillary: 224 mg/dL — ABNORMAL HIGH (ref 70–99)

## 2021-04-13 LAB — LACTIC ACID, PLASMA: Lactic Acid, Venous: 1.7 mmol/L (ref 0.5–1.9)

## 2021-04-13 MED ORDER — SODIUM CHLORIDE 0.9 % IV BOLUS
1000.0000 mL | Freq: Once | INTRAVENOUS | Status: AC
  Administered 2021-04-13: 1000 mL via INTRAVENOUS

## 2021-04-13 NOTE — Discharge Instructions (Signed)
STOP taking the REMERON.  This may be causing some of your symptoms.  Be sure to drink plenty of fluids as there is evidence of dehydration and your labs.    Follow-up with your primary care physician, call them today for an appointment this week or next week.  If at any point there is new or worsening confusion, fever, or any other new/concerning symptoms then return to the hospital or call 911.

## 2021-04-13 NOTE — ED Notes (Signed)
Patient transported to CT 

## 2021-04-13 NOTE — ED Provider Notes (Signed)
[facility] EMERGENCY DEPARTMENT Provider Note   CSN: [id number] Arrival date & time: 04/13/21  1010  LEVEL 5 CAVEAT - ALTERED MENTAL STATUS  History Chief Complaint  Patient presents with  . Altered Mental Status  . Paranoid    Jerry Porter is a 70 y.o. male.  HPI 70 year old male presents with altered mental status.  History is mostly taken from the daughter.  The patient has been having intermittent altered mental status for about a week but is much more prominent over the last 2 nights.  Is mostly at night.  He seems to have concerned about people coming to kill him.  The daughter seems to think these are recollections from previous time when there was war in his home country.  He has had a maximum temperature of 100.1 once.  He was recently started on Remeron on 4/27 for decreased eating and may be some depression.  He has not had any significant cough or headache.  No vomiting.  His sugars have been higher than typical, running in the high 200s.  He has not indicated he is suicidal.  They have noticed some bilateral foot swelling for the last little bit.  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  Past Medical History:  Diagnosis Date  . Diabetes mellitus without complication (HCC)   . High cholesterol   . Hypertension   . Myocardial infarct [facility])     Patient Active Problem List   Diagnosis Date Noted  . Vertigo 11/14/2018  . Benign essential HTN 11/14/2018  . CAD (coronary artery disease) 11/14/2018  . HLD (hyperlipidemia) 11/14/2018  . 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 Hypokalemia 11/14/2018  . Hyponatremia 11/14/2018  . AKI (acute kidney injury) (HCC) 11/14/2018    Past Surgical History:  Procedure Laterality Date  . CATARACT EXTRACTION    . CORONARY ANGIOPLASTY WITH STENT PLACEMENT    . HERNIA REPAIR         No family history on file.  Social History   Tobacco Use  . Smoking status: Former Games developer  .  Smokeless tobacco: Never Used  Substance Use Topics  . Alcohol use: No  . Drug use: No    Home Medications Prior to Admission medications   Medication Sig Start Date End Date Taking? Authorizing Provider  amLODipine (NORVASC) 10 MG tablet Take 10 mg by mouth daily.    [provider]  aspirin 81 MG tablet Take 81 mg by mouth daily.     [provider]  atorvastatin (LIPITOR) 80 MG tablet Take 80 mg by mouth daily.    [provider]  clopidogrel (PLAVIX) 75 MG tablet Take 75 mg by mouth daily.    [provider]  glipiZIDE (GLUCOTROL) 5 MG tablet Take by mouth daily before breakfast.    [provider]  losartan (COZAAR) 100 MG tablet Take 100 mg by mouth daily.    [provider]  metoprolol succinate (TOPROL-XL) 50 MG 24 hr tablet Take 150 mg by mouth daily. Take with or immediately following a meal.     [provider]  ondansetron (ZOFRAN) 4 MG tablet Take 1 tablet (4 mg total) by mouth every 8 (eight) hours as needed for nausea or vomiting. 12/19/18   Gilles Chiquito, MD    Allergies    Patient has no known allergies.  Review of Systems   Review of Systems  Unable to perform ROS: Mental status change  Physical Exam Updated Vital Signs BP (!) 152/77   Pulse 63   Temp 99.1 F (37.3 C) (Rectal)   Resp 15   Wt 75.1 kg   SpO2 100%   BMI 22.45 kg/m   Physical Exam Vitals and nursing note reviewed.  Constitutional:      Appearance: He is well-developed. He is not diaphoretic.  HENT:     Head: Normocephalic and atraumatic.     Right Ear: External ear normal.     Left Ear: External ear normal.     Nose: Nose normal.  Eyes:     General:        Right eye: No discharge.        Left eye: No discharge.     Extraocular Movements: Extraocular movements intact.     Pupils: Pupils are equal, round, and reactive to light.  Cardiovascular:     Rate and Rhythm: Normal rate and regular rhythm.     Heart sounds:  Normal heart sounds.  Pulmonary:     Effort: Pulmonary effort is normal.     Breath sounds: Normal breath sounds.  Abdominal:     General: There is no distension.     Palpations: Abdomen is soft.     Tenderness: There is no abdominal tenderness.  Musculoskeletal:     Cervical back: Neck supple. No rigidity.     Right lower leg: Edema present.     Left lower leg: Edema present.     Comments: Bilateral feet swelling  Skin:    General: Skin is warm and dry.  Neurological:     Mental Status: He is alert.     Comments: Patient has stuttering speech, but doesn't answer most questions. Equal strength in BUE. Equal but decreased strength in bilateral lower extremities  Psychiatric:        Mood and Affect: Mood is not anxious.     ED Results / Procedures / Treatments   Labs (all labs ordered are listed, but only abnormal results are displayed) Labs Reviewed  COMPREHENSIVE METABOLIC PANEL - Abnormal; Notable for the following components:      Result Value   Sodium 133 (*)    CO2 21 (*)    Glucose, Bld 134 (*)    BUN 29 (*)    Creatinine, Ser 1.50 (*)    GFR, Estimated 50 (*)    All other components within normal limits  CBG MONITORING, ED - Abnormal; Notable for the following components:   Glucose-Capillary 224 (*)    All other components within normal limits  I-STAT VENOUS BLOOD GAS, ED - Abnormal; Notable for the following components:   Acid-Base Excess 3.0 (*)    All other components within normal limits  RESP PANEL BY RT-PCR (FLU A&B, COVID) ARPGX2  CULTURE, BLOOD (ROUTINE X 2)  CULTURE, BLOOD (ROUTINE X 2)  URINE CULTURE  LACTIC ACID, PLASMA  CBC WITH DIFFERENTIAL/PLATELET  URINALYSIS, ROUTINE W REFLEX MICROSCOPIC    EKG EKG Interpretation  Date/Time:  Thursday Apr 13 2021 11:39:57 EDT Ventricular Rate:  61 PR Interval:  170 QRS Duration: 81 QT Interval:  385 QTC Calculation: 388 R Axis:   68 Text Interpretation: Sinus rhythm Minimal ST elevation, anterior  leads st elevations similar to Jan 2020 Confirmed by Pricilla Loveless [phone]) on 04/13/2021 12:02:45 PM   Radiology DG Chest 2 View  Result Date: 04/13/2021 CLINICAL DATA:  Altered mental status EXAM: CHEST - 2 VIEW COMPARISON:  10/07/2019 FINDINGS: Cardiac shadows within normal limits. The  lungs are well aerated bilaterally. Mild apical scarring is again seen and stable. No focal infiltrate or effusion is seen. No acute bony abnormality is noted. IMPRESSION: No acute abnormality noted. Electronically Signed   By: Alcide Clever M.D.   On: 04/13/2021 11:35   CT Head Wo Contrast  Result Date: 04/13/2021 CLINICAL DATA:  Altered mental status EXAM: CT HEAD WITHOUT CONTRAST TECHNIQUE: Contiguous axial images were obtained from the base of the skull through the vertex without intravenous contrast. COMPARISON:  11/14/2018 FINDINGS: Brain: No evidence of acute infarction, hemorrhage, hydrocephalus, extra-axial collection or mass lesion/mass effect. Prior right cerebellar infarct is again noted and stable. Basal ganglia calcifications are noted bilaterally. Vascular: No hyperdense vessel or unexpected calcification. Skull: Normal. Negative for fracture or focal lesion. Sinuses/Orbits: No acute finding. Other: None. IMPRESSION: Chronic right cerebellar infarct. No acute intracranial abnormality noted. Electronically Signed   By: Alcide Clever M.D.   On: 04/13/2021 11:34    Procedures Procedures   Medications Ordered in ED Medications  sodium chloride 0.9 % bolus 1,000 mL ( Intravenous Stopped 04/13/21 1347)    ED Course  I have reviewed the triage vital signs and the nursing notes.  Pertinent labs & imaging results that were available during my care of the patient were reviewed by me and considered in my medical decision making (see chart for details).    MDM Rules/Calculators/A&P                          Further talking with family indicates that the patient has had a slow cognitive decline since the  beginning of this year.  The paranoia part is new and probably related to the Remeron.  I have advised him to stop this.  Discussing with daughter at the bedside and family over the phone, they are concerned he has some early dementia.  Initial work-up here is unremarkable.  There is no obvious sign of an infection.  My suspicion that he has a CNS infection is pretty low.  Talking to family at the bedside right now he is acting normal and not altered.  I did offer observation admission and further work-up including MRI which is not available at this facility.  However family would like to take patient home and follow-up as an outpatient.  I think this is reasonable but did recommend they stop the Remeron and follow-up closely with PCP.  Discussed return precautions.  He is noted to have a mild bump in his creatinine which probably is not helping but also has not consistent with renal failure.  He was given IV fluids.  Encouraged to drink more at home. Final Clinical Impression(s) / ED Diagnoses Final diagnoses:  Altered mental status, unspecified altered mental status type  Dehydration    Rx / DC Orders ED Discharge Orders    None       Pricilla Loveless, MD 04/13/21 1423

## 2021-04-13 NOTE — ED Notes (Signed)
Unsuccessful IV attempt x2.  

## 2021-04-13 NOTE — ED Triage Notes (Signed)
Per daughter, pt has been confused x 2d; he is talking to/about people that have died; he thought someone was trying to kill him; he started Remeron on 04/05/21; this is the only new med in his routine

## 2021-04-14 LAB — URINE CULTURE: Culture: <10000 — AB

## 2021-06-30 ENCOUNTER — Emergency Department (HOSPITAL_COMMUNITY): Payer: Medicare HMO

## 2021-06-30 ENCOUNTER — Other Ambulatory Visit: Payer: Self-pay

## 2021-06-30 ENCOUNTER — Emergency Department (HOSPITAL_COMMUNITY)
Admission: EM | Admit: 2021-06-30 | Discharge: 2021-06-30 | Disposition: A | Discharge status: Home / Self Care | Payer: Medicare HMO | Attending: Emergency Medicine | Admitting: Emergency Medicine

## 2021-06-30 ENCOUNTER — Encounter (HOSPITAL_COMMUNITY): Payer: Self-pay

## 2021-06-30 DIAGNOSIS — R29898 Other symptoms and signs involving the musculoskeletal system: Secondary | ICD-10-CM | POA: Insufficient documentation

## 2021-06-30 DIAGNOSIS — Z7984 Long term (current) use of oral hypoglycemic drugs: Secondary | ICD-10-CM | POA: Insufficient documentation

## 2021-06-30 DIAGNOSIS — Z79899 Other long term (current) drug therapy: Secondary | ICD-10-CM | POA: Diagnosis not present

## 2021-06-30 DIAGNOSIS — R4182 Altered mental status, unspecified: Secondary | ICD-10-CM | POA: Insufficient documentation

## 2021-06-30 DIAGNOSIS — E785 Hyperlipidemia, unspecified: Secondary | ICD-10-CM | POA: Insufficient documentation

## 2021-06-30 DIAGNOSIS — Z7982 Long term (current) use of aspirin: Secondary | ICD-10-CM | POA: Insufficient documentation

## 2021-06-30 DIAGNOSIS — R251 Tremor, unspecified: Secondary | ICD-10-CM | POA: Diagnosis not present

## 2021-06-30 DIAGNOSIS — Z20822 Contact with and (suspected) exposure to covid-19: Secondary | ICD-10-CM | POA: Diagnosis not present

## 2021-06-30 DIAGNOSIS — Y900 Blood alcohol level of less than 20 mg/100 ml: Secondary | ICD-10-CM | POA: Diagnosis not present

## 2021-06-30 DIAGNOSIS — I219 Acute myocardial infarction, unspecified: Secondary | ICD-10-CM | POA: Diagnosis not present

## 2021-06-30 DIAGNOSIS — E1169 Type 2 diabetes mellitus with other specified complication: Secondary | ICD-10-CM | POA: Insufficient documentation

## 2021-06-30 DIAGNOSIS — I2510 Atherosclerotic heart disease of native coronary artery without angina pectoris: Secondary | ICD-10-CM | POA: Insufficient documentation

## 2021-06-30 DIAGNOSIS — R6889 Other general symptoms and signs: Secondary | ICD-10-CM | POA: Insufficient documentation

## 2021-06-30 DIAGNOSIS — R011 Cardiac murmur, unspecified: Secondary | ICD-10-CM | POA: Insufficient documentation

## 2021-06-30 DIAGNOSIS — I10 Essential (primary) hypertension: Secondary | ICD-10-CM | POA: Insufficient documentation

## 2021-06-30 DIAGNOSIS — R419 Unspecified symptoms and signs involving cognitive functions and awareness: Secondary | ICD-10-CM

## 2021-06-30 DIAGNOSIS — Z87891 Personal history of nicotine dependence: Secondary | ICD-10-CM | POA: Insufficient documentation

## 2021-06-30 DIAGNOSIS — Z7902 Long term (current) use of antithrombotics/antiplatelets: Secondary | ICD-10-CM | POA: Diagnosis not present

## 2021-06-30 LAB — PROTIME-INR
INR: 1 (ref 0.8–1.2)
Prothrombin Time: 13.1 seconds (ref 11.4–15.2)

## 2021-06-30 LAB — COMPREHENSIVE METABOLIC PANEL
ALT: 35 U/L (ref 0–44)
AST: 35 U/L (ref 15–41)
Albumin: 4.4 g/dL (ref 3.5–5.0)
Alkaline Phosphatase: 77 U/L (ref 38–126)
Anion gap: 11 (ref 5–15)
BUN: 19 mg/dL (ref 8–23)
CO2: 24 mmol/L (ref 22–32)
Calcium: 10.2 mg/dL (ref 8.9–10.3)
Chloride: 98 mmol/L (ref 98–111)
Creatinine, Ser: 1.4 mg/dL — ABNORMAL HIGH (ref 0.61–1.24)
GFR, Estimated: 55 mL/min — ABNORMAL LOW (ref >60)
Glucose, Bld: 109 mg/dL — ABNORMAL HIGH (ref 70–99)
Potassium: 4.3 mmol/L (ref 3.5–5.1)
Sodium: 133 mmol/L — ABNORMAL LOW (ref 135–145)
Total Bilirubin: 0.9 mg/dL (ref 0.3–1.2)
Total Protein: 8 g/dL (ref 6.5–8.1)

## 2021-06-30 LAB — DIFFERENTIAL
Abs Immature Granulocytes: 0.01 10*3/uL (ref 0.00–0.07)
Basophils Absolute: 0.1 10*3/uL (ref 0.0–0.1)
Basophils Relative: 1 %
Eosinophils Absolute: 0.1 10*3/uL (ref 0.0–0.5)
Eosinophils Relative: 2 %
Immature Granulocytes: 0 %
Lymphocytes Relative: 25 %
Lymphs Abs: 1.4 10*3/uL (ref 0.7–4.0)
Monocytes Absolute: 0.6 10*3/uL (ref 0.1–1.0)
Monocytes Relative: 11 %
Neutro Abs: 3.4 10*3/uL (ref 1.7–7.7)
Neutrophils Relative %: 61 %

## 2021-06-30 LAB — URINALYSIS, ROUTINE W REFLEX MICROSCOPIC
Bacteria, UA: NONE SEEN
Bilirubin Urine: NEGATIVE
Glucose, UA: 50 mg/dL — AB
Hgb urine dipstick: NEGATIVE
Ketones, ur: NEGATIVE mg/dL
Leukocytes,Ua: NEGATIVE
Nitrite: NEGATIVE
Protein, ur: 100 mg/dL — AB
Specific Gravity, Urine: 1.012 (ref 1.005–1.030)
pH: 7 (ref 5.0–8.0)

## 2021-06-30 LAB — I-STAT CHEM 8, ED
BUN: 24 mg/dL — ABNORMAL HIGH (ref 8–23)
Calcium, Ion: 1.14 mmol/L — ABNORMAL LOW (ref 1.15–1.40)
Chloride: 103 mmol/L (ref 98–111)
Creatinine, Ser: 1.3 mg/dL — ABNORMAL HIGH (ref 0.61–1.24)
Glucose, Bld: 106 mg/dL — ABNORMAL HIGH (ref 70–99)
HCT: 50 % (ref 39.0–52.0)
Hemoglobin: 17 g/dL (ref 13.0–17.0)
Potassium: 4.3 mmol/L (ref 3.5–5.1)
Sodium: 135 mmol/L (ref 135–145)
TCO2: 23 mmol/L (ref 22–32)

## 2021-06-30 LAB — CBC
HCT: 48.5 % (ref 39.0–52.0)
Hemoglobin: 15.5 g/dL (ref 13.0–17.0)
MCH: 27.1 pg (ref 26.0–34.0)
MCHC: 32 g/dL (ref 30.0–36.0)
MCV: 84.9 fL (ref 80.0–100.0)
Platelets: 243 10*3/uL (ref 150–400)
RBC: 5.71 MIL/uL (ref 4.22–5.81)
RDW: 14.3 % (ref 11.5–15.5)
WBC: 5.6 10*3/uL (ref 4.0–10.5)
nRBC: 0 % (ref 0.0–0.2)

## 2021-06-30 LAB — APTT: aPTT: 24 seconds (ref 24–36)

## 2021-06-30 LAB — ETHANOL: Alcohol, Ethyl (B): <10 mg/dL (ref <10)

## 2021-06-30 LAB — RESP PANEL BY RT-PCR (FLU A&B, COVID) ARPGX2
Influenza A by PCR: NEGATIVE
Influenza B by PCR: NEGATIVE
SARS Coronavirus 2 by RT PCR: NEGATIVE

## 2021-06-30 NOTE — ED Triage Notes (Signed)
Pt arrives POV for eval of altered mental status, hallucinations, dysphasia, new tremors, new confusion. Family reports that this has been happening for about a month. Of note, was recently diagnosed and treated for syphilis on 6/8. No unilateral weakness or deficits noted, exceptionally poor historian in triage. Background provided by granddaughter and daughter.

## 2021-06-30 NOTE — ED Provider Notes (Signed)
[name] EMERGENCY DEPARTMENT Provider Note   CSN: [id number] Arrival date & time: 06/30/21  1755     History Chief Complaint  Patient presents with   Altered Mental Status    Jerry Porter is a 70 y.o. male.   Altered Mental Status  70 year old male PMHx T2DM, HTN, HLD, CAD, presenting for progressive hallucinations, irritability, insomnia, decreased appetite over the past couple months, receptive aphasia with inappropriate responses x2w, now w/ intermittent upper extremity tremors throughout the day yesterday, w/ urinary incontinence occurring w/ x2 of those episodes. He was evaluated for this in 04/2021 w/ ct head noting chronic right cerebellar infarct, otherwise negative work-up. 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  No further medical concerns at this time, including fevers, chills, sweats, cough, sneeze, rhinorrhea, sob, cp, bowel/bladder changes, vomiting, syncopal episodes, seizures  History primarily obtained from patient's granddaughter, chart review, limited amount from patient.  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 No pertinent family history.  Social History   Tobacco Use   Smoking status: Former   Smokeless tobacco: Never  Substance Use Topics   Alcohol use: No   Drug use: No     Home Medications Prior to Admission medications   Medication Sig Start Date End Date Taking? Authorizing Provider  amLODipine (NORVASC) 10 MG tablet Take 10 mg by mouth daily.    [provider]  aspirin 81 MG tablet Take 81 mg by mouth daily.     [provider]  atorvastatin (LIPITOR) 80 MG tablet Take 80 mg by mouth daily.    [provider]  clopidogrel (PLAVIX) 75 MG tablet Take 75 mg by mouth daily.    [provider]  glipiZIDE (GLUCOTROL) 5 MG tablet Take by mouth daily before breakfast.    [provider]  losartan (COZAAR) 100 MG tablet Take 100 mg by mouth daily.    [provider]  metoprolol succinate (TOPROL-XL) 50 MG 24 hr tablet Take 150 mg by mouth daily. Take with or immediately following a meal.     [provider]  ondansetron (ZOFRAN) 4 MG tablet Take 1 tablet (4 mg total) by mouth every 8 (eight) hours as needed for nausea or vomiting. 12/19/18   Gilles Chiquito, MD    Allergies    Patient has no known allergies.  Review of Systems   Review of Systems  All other systems reviewed and are negative.  Physical Exam Updated Vital Signs BP 140/69   Pulse 62   Temp 98.9 F (37.2 C) (Oral)   Resp 13   Ht 6' (1.829 m)   Wt 75 kg   SpO2 99%   BMI 22.42 kg/m   Physical Exam Vitals and nursing note reviewed.  Constitutional:  General: He is not in acute distress. HENT:     Head: Normocephalic and atraumatic.     Nose: Nose normal.     Mouth/Throat:     Mouth: Mucous membranes are moist.     Pharynx: Oropharynx is clear.  Eyes:     Extraocular Movements: Extraocular movements intact.     Conjunctiva/sclera: Conjunctivae normal.     Pupils: Pupils are equal, round, and reactive to light.  Cardiovascular:     Rate and Rhythm: Normal rate and regular rhythm.     Heart sounds: Murmur heard.    No friction rub. No gallop.  Pulmonary:     Effort: Pulmonary effort is normal.     Breath  sounds: No stridor. No wheezing, rhonchi or rales.  Abdominal:     General: There is no distension.     Palpations: Abdomen is soft.     Tenderness: There is no abdominal tenderness. There is no guarding or rebound.  Musculoskeletal:     Cervical back: Normal range of motion. No rigidity.     Right lower leg: No edema.     Left lower leg: No edema.  Skin:    General: Skin is warm and dry.     Capillary Refill: Capillary refill takes less than 2 seconds.  Neurological:     Mental Status: He is alert.     Comments: Mental status: Alert and oriented to person and place, stated year was 2001 Speech: clear, no dysarthria CN II: Blinking to threat CN III/IV/VI: PERRL, EOMI CN V: facial sensation to LT and mastication intact CN VII: no facial droop CN VIII: no nystagmus, audition intact to speech VN IX/X: swallow intact CN XI: trapezius and SCM motor function intact CN XII: midline tongue w/o atrophy or fasciculation RUE: 5/5 strength, sensation to LT intact LUE: 5/5 strength, sensation to LT intact RLE: 5/5 strength, sensation to LT intact LLE: 5/5 strength, sensation to LT intact Coordination: Tremors particularly affecting LUE, both at rest as well as with intentional movement, with apparent cogwheeling.  Multi beat clonus in bilateral lower extremities Gait: Untested   Psychiatric:     Comments: Patient was quiet throughout most of my evaluation     ED Results / Procedures / Treatments   Labs (all labs ordered are listed, but only abnormal results are displayed) Labs Reviewed  COMPREHENSIVE METABOLIC PANEL - Abnormal; Notable for the following components:      Result Value   Sodium 133 (*)    Glucose, Bld 109 (*)    Creatinine, Ser 1.40 (*)    GFR, Estimated 55 (*)    All other components within normal limits  URINALYSIS, ROUTINE W REFLEX MICROSCOPIC - Abnormal; Notable for the following components:   Glucose, UA 50 (*)    Protein, ur 100 (*)    All other components  within normal limits  I-STAT CHEM 8, ED - Abnormal; Notable for the following components:   BUN 24 (*)    Creatinine, Ser 1.30 (*)    Glucose, Bld 106 (*)    Calcium, Ion 1.14 (*)    All other components within normal limits  RESP PANEL BY RT-PCR (FLU A&B, COVID) ARPGX2  ETHANOL  PROTIME-INR  APTT  CBC  DIFFERENTIAL  RAPID URINE DRUG SCREEN, HOSP PERFORMED  RPR    EKG EKG Interpretation  Date/Time:  Friday June 30 2021 21:13:44 EDT Ventricular Rate:  59 PR Interval:  174 QRS Duration: 83 QT Interval:  403 QTC Calculation: 400  R Axis:   46 Text Interpretation: Sinus rhythm ST elevation, consider inferior injury When compared with ECG of 04/13/2021, No significant change was found Confirmed by Dione Booze ([id number]) on 06/30/2021 9:42:44 PM  Radiology CT HEAD WO CONTRAST  Result Date: 06/30/2021 CLINICAL DATA:  Neuro deficit, acute, stroke suspected Altered mental status. Dysphagia. Tremors. Confusion. 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 EXAM: CT HEAD WITHOUT CONTRAST TECHNIQUE: Contiguous axial images were obtained from the base of the skull through the vertex without intravenous contrast. COMPARISON:  Head CT 04/13/2021.  Brain MRI 05/17/2021 FINDINGS: Brain: Stable degree of atrophy and chronic small vessel ischemia. Remote right cerebellar infarct is unchanged. Small left cerebellar infarct on prior MRI has no definite CT correlate. Stable basal gangliar mineralization. No intracranial hemorrhage, mass effect, or midline shift. No hydrocephalus. The basilar cisterns are patent. No evidence of territorial infarct or acute ischemia. No extra-axial or intracranial fluid collection. Vascular: Atherosclerosis of skullbase vasculature without hyperdense vessel or abnormal calcification. Skull: No fracture or focal lesion. Sinuses/Orbits: Paranasal sinuses and mastoid air cells are clear. The visualized orbits are unremarkable. Bilateral lens resection. Other: None. IMPRESSION: 1.  No acute intracranial abnormality. 2. Stable atrophy, chronic small vessel ischemia, and remote right cerebellar infarct. Electronically Signed   By: Narda Rutherford M.D.   On: 06/30/2021 19:17    Procedures Procedures   Medications Ordered in ED Medications - No data to display  ED Course  I have reviewed the triage vital signs and the nursing notes.  Pertinent labs & imaging results that were available during my care of the patient were reviewed by me and considered in my medical decision making (see chart for details).    MDM Rules/Calculators/A&P                          This is a 70 year old male PMHx T2DM, HTN, HLD, CAD, presenting with progressive neuropsychiatric deficits over the last several months, including progressively frequent tremors, hallucinations, irritability.  HDS, VSS.  Neuro exam significant for some cogwheeling rigidity in LUE, BLE multiple beat clonus, and general reserved behavior during the evaluation, although this may be due to slight language barrier.  All studies independently reviewed by myself, d/w the attending physician, factored into my MDM. -EKG: Sinus bradycardia 59 bpm, normal axis, normal intervals, minimal concave ST elevations in inferior leads evident on prior from 04/13/2021 -CT head noncontrast: Stable atrophy, chronic small vessel ischemia, remote right cerebellar infarct -Unremarkable: COVID/influenza PCR, ethanol, PT/PTT, CBCd, CMP, UA  Presentation appears most consistent with gradual progression of chronic symptoms likely 2/2 dementia, with many Lewy body dementia like qualities (hallucinations, irritability, insomnia, cogwheel rigidity).  Neurologic presentation not consistent with any common distribution for focal CNS or PNS lesions.  Unlikely seizure, has not displayed any classic semiology or postictal state.  No significant electrolyte derangements on labs.  Does not look overtly septic, afebrile and HDS, without any systemic symptoms.  No  significant chest pain or pulse differences to suggest neurodeficits 2/2 dissection.  Does not look clinically intoxicated.  No significant medication changes recently.  Therefore, feel the patient is stable for discharge home with close outpatient follow-up.  I specifically recommended that they follow-up with his neurologist.  Strict return precautions discussed.  Granddaughter understands and agrees with plan.  Patient HDS on reevaluation, nontoxic appearing, and subsequently discharged.  Final Clinical Impression(s) / ED Diagnoses Final diagnoses:  Deficit in comprehension  Occasional tremors  Cogwheel rigidity  Rx / DC Orders ED Discharge Orders     None        Colvin Caroli, MD 07/01/21 4650    Cathren Laine, MD 07/03/21 1704

## 2021-06-30 NOTE — Discharge Instructions (Addendum)
Mr. Kelley was evaluated for his tremors and episodes of urinary incontinence.  Our examination revealed some difficulty with comprehension, as some issues with motor function.  We feel that the symptoms are progression of his chronic neurologic issues, and that he would be best served with outpatient follow-up with Dr. Maple Hudson.  Our work-up here, including a CT scan of the head, did not show findings concerning for significantly threatening disease.  Please return to the ER for worsening, such as one-sided weakness, sudden change in speech deficits, new fever or vomiting.

## 2021-06-30 NOTE — ED Provider Notes (Signed)
Emergency Medicine Provider Triage Evaluation Note  Jerry Porter , a 70 y.o. male  was evaluated in triage.  Pt complains of confusion.  Has been feeling different for last month, his speech unintelligible per the daughter and.  He has been confused, stating reportedly having shaking for the last few days. Earlier in June he was treated for syphilis.  Review of Systems  Positive: Confusion, dysarthria, generalized weakness  Negative: Dysuria   Physical Exam  BP (!) 158/76   Pulse 67   Temp 98.9 F (37.2 C) (Oral)   Resp 18   SpO2 100%  Gen:   Awake, no distress   Resp:  Normal effort  MSK:   Moves extremities without difficulty  Other:  Shaking, unintelligle speech, unable to follow commands  Medical Decision Making  Medically screening exam initiated at 6:26 PM.  Appropriate orders placed.  DIMITRIOUS MICCICHE was informed that the remainder of the evaluation will be completed by another provider, this initial triage assessment does not replace that evaluation, and the importance of remaining in the ED until their evaluation is complete.     Theron Arista, PA-C 06/30/21 Silva Bandy    Dione Booze, MD 07/03/21 [phone]

## 2021-07-01 LAB — RPR
RPR Ser Ql: REACTIVE — AB
RPR Titer: 1:1 {titer}

## 2021-07-03 LAB — T.PALLIDUM AB, TOTAL: T Pallidum Abs: REACTIVE — AB

## 2024-06-09 DEATH — deceased
